# Patient Record
Sex: Male | Born: 1965
Health system: Southern US, Community
[De-identification: ages and names within clinical notes are randomized; demographics above are authoritative.]

## PROBLEM LIST (undated history)

## (undated) DIAGNOSIS — Z872 Personal history of diseases of the skin and subcutaneous tissue: Secondary | ICD-10-CM

## (undated) DIAGNOSIS — G459 Transient cerebral ischemic attack, unspecified: Secondary | ICD-10-CM

## (undated) DIAGNOSIS — K219 Gastro-esophageal reflux disease without esophagitis: Secondary | ICD-10-CM

## (undated) DIAGNOSIS — Q2112 Patent foramen ovale: Secondary | ICD-10-CM

## (undated) DIAGNOSIS — K5792 Diverticulitis of intestine, part unspecified, without perforation or abscess without bleeding: Secondary | ICD-10-CM

## (undated) DIAGNOSIS — Q211 Atrial septal defect: Secondary | ICD-10-CM

## (undated) DIAGNOSIS — R079 Chest pain, unspecified: Secondary | ICD-10-CM

## (undated) DIAGNOSIS — G4730 Sleep apnea, unspecified: Secondary | ICD-10-CM

## (undated) DIAGNOSIS — F419 Anxiety disorder, unspecified: Secondary | ICD-10-CM

## (undated) HISTORY — PX: COLON SURGERY: SHX602

## (undated) HISTORY — PX: MOUTH SURGERY: SHX715

## (undated) HISTORY — DX: Patent foramen ovale: Q21.12

## (undated) HISTORY — DX: Chest pain, unspecified: R07.9

## (undated) HISTORY — DX: Atrial septal defect: Q21.1

---

## 2005-01-13 ENCOUNTER — Emergency Department (HOSPITAL_COMMUNITY): Admission: RE | Admit: 2005-01-13 | Discharge: 2005-01-14 | Payer: Self-pay | Admitting: Family Medicine

## 2005-02-01 ENCOUNTER — Ambulatory Visit (HOSPITAL_COMMUNITY): Admission: RE | Admit: 2005-02-01 | Discharge: 2005-02-01 | Payer: Self-pay | Admitting: Gastroenterology

## 2006-11-28 ENCOUNTER — Ambulatory Visit (HOSPITAL_COMMUNITY): Admission: RE | Admit: 2006-11-28 | Discharge: 2006-11-28 | Payer: Self-pay | Admitting: Surgery

## 2006-11-28 ENCOUNTER — Emergency Department (HOSPITAL_COMMUNITY): Admission: EM | Admit: 2006-11-28 | Discharge: 2006-11-28 | Payer: Self-pay | Admitting: Emergency Medicine

## 2007-01-15 ENCOUNTER — Inpatient Hospital Stay (HOSPITAL_COMMUNITY): Admission: EM | Admit: 2007-01-15 | Discharge: 2007-01-17 | Payer: Self-pay | Admitting: Emergency Medicine

## 2007-01-15 ENCOUNTER — Encounter: Admission: RE | Admit: 2007-01-15 | Discharge: 2007-01-15 | Payer: Self-pay | Admitting: Family Medicine

## 2007-03-27 ENCOUNTER — Encounter: Admission: RE | Admit: 2007-03-27 | Discharge: 2007-03-27 | Payer: Self-pay | Admitting: Surgery

## 2007-04-26 ENCOUNTER — Encounter (INDEPENDENT_AMBULATORY_CARE_PROVIDER_SITE_OTHER): Payer: Self-pay | Admitting: Surgery

## 2007-04-26 ENCOUNTER — Inpatient Hospital Stay (HOSPITAL_COMMUNITY): Admission: RE | Admit: 2007-04-26 | Discharge: 2007-05-05 | Payer: Self-pay | Admitting: Surgery

## 2008-08-18 ENCOUNTER — Inpatient Hospital Stay (HOSPITAL_COMMUNITY): Admission: AD | Admit: 2008-08-18 | Discharge: 2008-08-20 | Payer: Self-pay | Admitting: Neurology

## 2008-08-18 ENCOUNTER — Encounter: Payer: Self-pay | Admitting: Emergency Medicine

## 2008-08-18 ENCOUNTER — Ambulatory Visit: Payer: Self-pay | Admitting: Internal Medicine

## 2008-08-19 ENCOUNTER — Encounter (INDEPENDENT_AMBULATORY_CARE_PROVIDER_SITE_OTHER): Payer: Self-pay | Admitting: Neurology

## 2009-08-10 ENCOUNTER — Emergency Department (HOSPITAL_COMMUNITY): Admission: EM | Admit: 2009-08-10 | Discharge: 2009-08-10 | Payer: Self-pay | Admitting: Emergency Medicine

## 2011-02-09 LAB — URINALYSIS, ROUTINE W REFLEX MICROSCOPIC
Bilirubin Urine: NEGATIVE
Nitrite: NEGATIVE
Urobilinogen, UA: 0.2 mg/dL (ref 0.0–1.0)
pH: 7 (ref 5.0–8.0)

## 2011-02-09 LAB — DIFFERENTIAL
Basophils Absolute: 0 10*3/uL (ref 0.0–0.1)
Basophils Relative: 1 % (ref 0–1)
Neutro Abs: 3.6 10*3/uL (ref 1.7–7.7)
Neutrophils Relative %: 62 % (ref 43–77)

## 2011-02-09 LAB — BASIC METABOLIC PANEL
Calcium: 8.9 mg/dL (ref 8.4–10.5)
GFR calc non Af Amer: >60 mL/min (ref >60)
Glucose, Bld: 93 mg/dL (ref 70–99)
Potassium: 3.8 mEq/L (ref 3.5–5.1)
Sodium: 139 mEq/L (ref 135–145)

## 2011-02-09 LAB — CBC
HCT: 43.5 % (ref 39.0–52.0)
Hemoglobin: 14.9 g/dL (ref 13.0–17.0)
MCV: 95.5 fL (ref 78.0–100.0)
Platelets: 186 10*3/uL (ref 150–400)
RBC: 4.56 MIL/uL (ref 4.22–5.81)

## 2011-03-21 NOTE — Discharge Summary (Signed)
NAMEVERNON, [name]         ACCOUNT NO.:  [id number]   MEDICAL RECORD NO.:  [id number]          PATIENT TYPE:  INP   LOCATION:  3027                         FACILITY:  [facility]   PHYSICIAN:  Pramod P. Pearlean Brownie, MD    DATE OF BIRTH:  1966/03/21   DATE OF ADMISSION:  08/18/2008  DATE OF DISCHARGE:  08/20/2008                               DISCHARGE SUMMARY   DIAGNOSES AT THE TIME OF DISCHARGE:  1. Transient ischemic attack with neurological symptoms resolved      status post full-dose intravenous tPA.  2. Diverticulitis.  3. Cigarette smoker.  4. CT of the brain on admission shows no acute abnormality.  Initial      limited magnetic resonance imaging with diffusion-weighted      (magnetic resonance) imaging only shows no acute infarct.  5. Magnetic resonance imaging of the brain shows no significant      abnormality.  Magnetic resonance angiography of the head is      negative and magnetic resonance angiography of the neck is      negative.  6. Carotid Doppler shows no internal carotid artery stenosis.  7. A 2D echocardiogram shows ejection fraction of 65% with no source      of embolus.  8. Electrocardiogram shows normal sinus rhythm.   Laboratory studies show homocystine of 12.5 and hemoglobin A1c 4.5.  Cholesterol 154, triglycerides 79, HDL of 30, and LDL 108.  Chemistry  with potassium 3.4, glucose 111, and total bilirubin 2.1.  Coagulations  on admission were normal.  CBC was normal.   HISTORY OF PRESENT ILLNESS:  [name] is a 45 year old,  right-handed Caucasian male who had sudden onset of speech difficulty  and right-handed weakness at 11:15 a.m. after coming out of the meeting  at work.  He presented to the emergency room where his speech  abnormality remained.  MRI of the brain showed no acute abnormality,  though physical exam showed worsening speech.  He had no focal weakness.  His NIH stroke scale was 2.  He was felt to be a candidate for IV tPA,  was given full-dose IV tPA and transferred to [facility] for  further stroke evaluation.   HOSPITAL COURSE:  MRI of the brain was negative for acute stroke.  It is  felt the patient likely had a TIA with resolution of symptoms.  He has  no followup therapy needs.  He was started on aspirin 81 mg for  secondary stroke prevention.  Vascular risk factors are elevated LDL at  108, for which a low-fat diet was recommended, as well as cigarette  smoking for which he was advised to quit.  There is no further inpatient  workup needed.  He has been tested for hypercoagulable state, and those  results are pending.  He will do an outpatient bubble TCD emboli  monitoring with Dr. Pearlean Brownie within the next month.   CONDITION ON DISCHARGE:  Neurologically normal.   DISCHARGE PLAN:  1. Discharge home with family.  2. Aspirin 81 mg a day for stroke prevention.  3. Outpatient bubble TCD emboli monitoring with Dr. Pearlean Brownie within the  next week.  4. Follow up with primary care physician within 1 month.  5. Stop smoking.      Annie Main, N.P.    ______________________________  Sunny Schlein. Pearlean Brownie, MD    SB/MEDQ  D:  08/20/2008  T:  08/20/2008  Job:  [id number]   cc:   Dr. Hessie Diener

## 2011-03-21 NOTE — Op Note (Signed)
[name], [name]         ACCOUNT NO.:  [id number]   MEDICAL RECORD NO.:  [id number]          PATIENT TYPE:  INP   LOCATION:  1234                         FACILITY:  [facility]   PHYSICIAN:  Wilmon Arms. Corliss Skains, M.D. DATE OF BIRTH:  01/10/1966   DATE OF PROCEDURE:  04/28/2007  DATE OF DISCHARGE:                               OPERATIVE REPORT   PREOPERATIVE DIAGNOSIS:  Postoperative bleeding after sigmoid colectomy.   POSTOPERATIVE DIAGNOSIS:  Postoperative bleeding after sigmoid  colectomy.   PROCEDURE PERFORMED:  1. Diagnostic laparoscopy converted to exploratory laparotomy.  2. Revision of colorectal anastomosis.   SURGEON:  Wilmon Arms. Corliss Skains, M.D.   ASSISTANT:  Anselm Pancoast. Zachery Dakins, M.D.   ANESTHESIA:  General endotracheal.   INDICATIONS:  The patient is a 45 year old male who is a patient of Dr.  Marca Ancona.  On 04/26/07 the patient underwent a laparoscopic assisted  sigmoid colectomy for recurrent diverticulitis.  The patient initially  had problems with low urine output and tachycardia on the first postop  night.  His hemoglobin at that time was 12.  His symptoms seemed to be  related to intravascular depletion possibly from his bowel prep.  The  patient improved with fluid boluses.  He received a large amount of  fluid overnight and his hemoglobin next morning was 10.9.  However  through the day his hemoglobin has trended down.  This morning it is  6.9.  The decision was made to proceed back to the operating room.  The  patient was stable with some intermittent fever as well as the abdominal  distension.   OPERATIVE FINDINGS:  The patient has had a large amount of blood in his  abdomen.  The bleeding source was found to be a mesenteric vessel just  posterior to his anastomosis.  This required taking down the  anastomosis, ligating the vessel and redoing anastomosis.   DESCRIPTION OF PROCEDURE:  The patient is brought to the operating room,  placed in supine  position on operating table.  After an adequate level  of general anesthesia was obtained, the patient's abdomen was prepped  with Betadine and draped in sterile fashion.  He already had a Foley  catheter.  A nasogastric tube was placed by anesthesia.  The staples  were removed.  His upper midline laparoscopic incision was opened.  The  fascial stay suture was removed.  I was able to bluntly dissect back  into the peritoneal cavity.  A stay suture of 0-0 Vicryl was placed  around the fascial opening and used to secure a Hasson cannula.  Pneumoperitoneum was obtained by insufflating CO2 maintaining maximal  pressure of 15 mmHg.  The laparoscope was inserted.  A large amount of  free blood was noted in the abdomen.  Two 5-mm ports were placed in the  right sided previous port sites.  The patient was positioned  Trendelenburg.  We irrigated and suctioned out of large amount of blood  from the pelvis.  No clear bleeding site was noted.  We turned our  attention to the upper abdomen and positioned the patient in reverse  Trendelenburg.  There was also large  amount of blood over his liver and  his spleen.  We suctioned out as much of this as possible.  We could not  clearly identify the bleeding source.  The decision was then made to  convert to an open procedure.  All the laparoscopic ports were removed.  We extended the patient's previous lower midline laparoscopic incision  up to the upper incision above the umbilicus.  The Balfour retractor was  inserted and the bladder blade was also used.  We washed out of large  amount of free blood from the abdomen.  The small bowel was packed in  the upper part of the abdomen and held with the [facility] extender.  We  could see that blood was accumulating in the pelvis near the  anastomosis.  Anastomosis was intact.  There seemed to be of a lot of  blood tracking of the left pericolic gutter but the source seemed to be  down the pelvis.  We diligently  searched for a definite bleeding site.  There are several areas which were slowly oozing, but this did not  explain the large amount of blood with found.  The decision was then  made to take down the anastomosis.  The interrupted silk sutures were  then slowly taken down individually with scissors.  Once we reached the  back wall of the anastomosis, a bleeding vessel was identified.  This  was a mesenteric vessel from the proximal descending colon.  This area  was grasped with clamp and ligated with a 2-0 silk tie.  We then  thoroughly irrigated the pelvis.  No other definite bleeding site was  noted.  We then recreated anastomosis.  We trimmed the ends of the  previous the anastomosis until we reached thin uninflamed tissue.  The  two ends reach without any tension.  2-0 interrupted silk sutures were  used to reapproximate the back wall.  We continued this around the  front.  The Gambee suture was used to imbricate the mucosa.  Once the  anastomosis was complete we inspected for good hemostasis.  The pelvis  was thoroughly irrigated.  The fascia was closed with a double-stranded  #1 PDS suture.  The subcutaneous tissues were irrigated and the skin was  closed with staples.  A dry dressing was applied.  The patient was then  extubated and brought to recovery in stable condition.  All sponge,  instrument, needle counts correct.      Wilmon Arms. Tsuei, M.D.  Electronically Signed     MKT/MEDQ  D:  04/28/2007  T:  04/29/2007  Job:  [id number]   cc:   Thomas A. Cornett, M.D.  [address] [place] [address]  [place] [address]

## 2011-03-21 NOTE — Discharge Summary (Signed)
[name], SIPPEL         ACCOUNT NO.:  [id number]   MEDICAL RECORD NO.:  [id number]          PATIENT TYPE:  INP   LOCATION:  1325                         FACILITY:  [facility]   PHYSICIAN:  Thomas A. Cornett, M.D.DATE OF BIRTH:  08/16/1966   DATE OF ADMISSION:  04/26/2007  DATE OF DISCHARGE:  05/05/2007                               DISCHARGE SUMMARY   ADMITTING DIAGNOSIS:  Sigmoid diverticulitis.   DISCHARGE DIAGNOSIS:  Sigmoid diverticulitis.   PROCEDURES PERFORMED:  1. Laparoscopic-assisted sigmoid colectomy.  2. Exploratory laparotomy due to postoperative bleeding on      postoperative day number two with revision of anastomosis and      evacuation of hemoperitoneum.   BRIEF HISTORY:  The patient is a 45 year old male with sigmoid  diverticulitis.  He presented for elective sigmoid colectomy due to  recurrent diverticulitis. on April 26, 2007.   HOSPITAL COURSE:  The patient's hospital course complicated by  postoperative bleeding.  He was returned to the operating room on  postoperative day #2 due to a significant drop in his hemoglobin.  Hemoperitoneum was encountered and this was from the posterior region of  his anastomosis from the mesentery.  The anastomosis was revised and the  bleeding was controlled.  Hemoperitoneum was evacuated.  He was then  placed in the Step Down Unit at this point in time and remained stable  for the next 48 hours and transferred to the floor.  Hemoglobin remained  around 10.  He had initially an elevated white count and this returned  to normal.  He had significant fever and this declined as time went on,  so at discharge he was afebrile.  He also had problems with severe  anxiety and shaking sweats and this was improved with Ativan.  He had a  wound that was opened on postoperative day #7, due to a small area of  fluid.  The fascia remained intact, this was managed with wet-to-dry  dressing changes b.i.d.  He remained afebrile over the  next 2 days, his  diet was advanced as his bowel function returned and he was on a soft  low residue diet at discharge.  His abdomen was soft, nontender, he was  ambulating, he was able to void without any difficulty and was  discharged home on postoperative day #9 in satisfactory condition.   DISCHARGE INSTRUCTIONS:  1. He will return this week to have his staples removed in the office.  2. Home Health will do his wound care twice a day, wet-to-dry with      normal saline.  3. He will be given a script for Vicodin ES and Ativan 0.5 mg p.o. q.6      p.r.n. anxiety.  4. He will eat a low residue diet.  5. He will shower and see me in 2 weeks.   CONDITION AT DISCHARGE:  Improved.      Thomas A. Cornett, M.D.  Electronically Signed     TAC/MEDQ  D:  05/05/2007  T:  05/05/2007  Job:  [id number]

## 2011-03-21 NOTE — Op Note (Signed)
[name], [name]         ACCOUNT NO.:  [id number]   MEDICAL RECORD NO.:  [id number]          PATIENT TYPE:  INP   LOCATION:  1525                         FACILITY:  [facility]   PHYSICIAN:  Clovis Pu. Cornett, M.D.DATE OF BIRTH:  20-Dec-1965   DATE OF PROCEDURE:  04/26/2007  DATE OF DISCHARGE:                               OPERATIVE REPORT   PREOP DIAGNOSIS:  Repeated bouts of sigmoid diverticulitis.   POSTOP DIAGNOSIS:  Repeated bouts of sigmoid diverticulitis.   PROCEDURE:  1. Laparoscopic-assisted sigmoid colectomy.  2. Takedown of splenic flexure.   SURGEON:  Maisie Fus A. Cornett, M.D.   ASSISTANTAngelia Mould. Derrell Lolling, M.D.   ANESTHESIA:  General endotracheal anesthesia with 10 mL of 0.25%  Sensorcaine local.   ESTIMATED BLOOD LOSS:  100 mL.   SPECIMEN:  Sigmoid colon to pathology.   DRAINS:  None.   INDICATIONS FOR PROCEDURE:  The patient is a 45 year old male who has  had recurrent bouts of sigmoid diverticulitis.  After discussion of the  options with him.  He wished to undergo an elective sigmoid colectomy  since he was having recurrent significant attacks.  He presents, today,  for that.  Informed consent was obtained after a long discussion in the  office.   DESCRIPTION OF PROCEDURE:  The patient was brought to the operating  suite, and placed supine.  After induction of general anesthesia, he was  placed in lithotomy; and his abdomen and perineum were prepped and  draped in a sterile fashion.  A 1-cm incision was made about 2 cm above  the umbilicus.  Dissection was carried down to the midline fascia; and  incision was made.  I pushed my finger through the peritoneal lining  into the abdominal cavity.  Pursestring suture of #0 Vicryl was placed;  and a 12-mm Hassan cannula was placed here under direct vision.   Pneumoperitoneum was created to 15 mmHg of CO2; and a laparoscope was  placed.  Laparoscopy was performed.  He had some chronic changes of the  sigmoid colon.  It was densely adherent to his left pelvic side wall in  the mid sigmoid colon.  I then placed a 5-mm ports, one in his right  lower quadrant; the second ion his left upper quadrant; and the third in  his right mid abdomen in the anterior clavicular line.   I then was able to grasp the sigmoid colon.  It was digitally adherent  to the left pelvic sidewall.  I used the harmonic skin scalpel and blunt  dissection to carefully dissect this away from what appeared to be the  iliac vessels and the ureter, which I could see quite easily with him,  since he was very thin. Once I was able to mobilize this away, I  mobilized down toward the pelvis.  He had some redundant sigmoid colon  that was tethered in his pelvis.  We carefully use harmonic scalpel and  blunt dissection to dissect this free.   We then dissected the right side of the mesentery of the distal sigmoid  colon entering  toward the rectum, identified the right ureter, and  stayed  well away from it.  I scored the peritoneum and found the  inferior mesenteric artery and the superior rectal branches going to the  rectum.  Once I mobilized the sigmoid colon from the lateral sidewall.  We then placed the patient in reverse Trendelenburg; and began to  mobilize the left colon along the left lateral attachments of the colon.  This was done under direct vision, all the way up to the splenic  flexure.  We then mobilized the splenic flexure around to the transverse  colon without difficulty to create a nice mobile colon, I thought.   Once all this was done, we were able to take the splenic flexure and  pull this down to at least the umbilicus or a little bit below that so I  felt we had very good mobilization of the splenic flexure at this point.  Once mobilization was done I inspected the gutter.  There were some  chronic inflammatory changes oozing from the mesentery, but no heavy  bleeding was noted.  I suctioned this out,  and this appeared very  hemostatic.  At this point in time, I felt that it was time to convert  to the open portion of the procedure.   The patient was then flattened out.  I measured 7 cm on his lower  abdominal wall halfway between his umbilicus and pubic symphysis.  I  then made the 7-cm incision, dissecting down to the midline fascia, and  then through the peritoneal lining into the abdominal cavity.  I used a  wound protector, and placed this in position.  I was then able to reach  in and pull out the sigmoid colon.  We had very good mobilization.  The  diseased segment down to the distal sigmoid colon we were able to pull  well into the wound protector.  We found a nice softball in the distal  sigmoid colon with no diverticula at the rectosigmoid junction that we  felt was a good distal endpoint.  The proximal end point was the distal  left colon.  The colon between had significant chronic inflammatory  changes; but the rest of the colon looked very good, I felt.  Once this  area was identified; we went ahead and I placed small bowel clamps  proximal-and-distal off this diseased segment, and then placed Kochers  on the diseased segment; and divided the bowel between those clamps and  using a scalpel blade.   Mesentery was then taken down using the harmonic scalpel which included  the inferior mesenteric vessels going toward the sigmoid colon.  This  was actually a sigmoid branch of the IMA; since this was not a cancer  operation, I did not ligate the IMA at its base though.  The LigaSure  was used for taking down the mesentery.  This preserved the main IMA  trunk going to the left colon, it looked like without compromise of  that.   Once this area was resected, it was passed off the field.  This was then  sent to pathology.  We then to both ends of the colon and this laid  together nicely with absolutely no tension whatsoever.  It was very floppy.  We then created an end-to-end  anastomosis using 3-0 silk  sutures in interrupted fashion.  Once the anastomosis was complete, it  looked tension-free and looked very nice I thought.  There was adequate  hemostasis of the mesentery.  We then irrigated off of the pelvis and  2  liters of saline.  We placed the colon back in the abdominal cavity;  and, again, this laid very nicely with a considerable amount of  floppiness and no tension that I could see.   At this point in time we suctioned out all excess irrigation.  We then  changed gloves and removed the wound protector.  We then closed the  fascia of the lower abdominal incision with a running #1 PDS.  I closed  the umbilical  port after moving all my ports with the pursestring suture of #0 Vicryl.  Staples were then used to close all skin incisions after the ports were  removed.  All final counts of sponge, needle, and instruments were found  to be correct at this portion of the case.  The patient was taken to  recovery in satisfactory condition.      Thomas A. Cornett, M.D.  Electronically Signed     TAC/MEDQ  D:  04/26/2007  T:  04/26/2007  Job:  [id number]   cc:   Miguel Aschoff, M.D.  Fax: [phone]   Griffith Citron, M.D.  Fax: [phone]

## 2011-03-21 NOTE — H&P (Signed)
NAME:  [name], [name]         ACCOUNT NO.:  [id number]   MEDICAL RECORD NO.:  [id number]          PATIENT TYPE:  EMS   LOCATION:  ED                           FACILITY:  [facility]   PHYSICIAN:  Pramod P. Pearlean Brownie, MD    DATE OF BIRTH:  01-13-1966   DATE OF ADMISSION:  08/18/2008  DATE OF DISCHARGE:                              HISTORY & PHYSICAL   REFERRING PHYSICIAN:  Tinnie Gens P. Caporossi, MD.   REASON FOR REFERRAL:  __________ stroke.   HISTORY OF PRESENT ILLNESS:  [name] is a 45 year old Caucasian male  who was brought to [facility] Emergency Room by [facility] when he  developed sudden onset of speech difficulties, right-sided numbness at  about 11 a.m. today.  The patient is unable to provide a history, which  was obtained from his friends who were present with him at work.  Apparently the patient was fine in the morning, attended a meeting, then  he states he was not feeling well, in fact, calling his sister over the  phone but had trouble speaking and word-finding difficulties.  He also  noticed some right-sided numbness in his hands and possibly some  weakness.  After coming to the emergency room these symptoms improved.  He was able to speak quite well and had practically no deficits except  some word-finding difficulties in the ER.  Initial CT scan of the head  was unremarkable.  Dr. Weldon Inches spoke to me and I advised to get an MRI  scan and when I arrived, the patient was in the MRI scanner.  The  patient requested that his MRI be interrupted because he wanted to pass  urine.  I saw the patient.  After he came out he was clearly having word-  finding speech difficulties and hence the MRI was stopped and the  patient was brought back to the emergency room and examined and he  clearly had word-finding difficulties and was struggling to speak,  though he could understand well.  He also had trouble with naming and  repetition.  There was no peripheral vision loss, facial  weakness, focal  extremity __________.  Patient's limited amount of  __________ been  obtained included __________ imaging and FLAIR images which did not show  a definite acute stroke.  T2 weighted imaging __________ vessels seemed  to be appropriate except the right vertebral artery flow was of  diminished caliber, likely from __________ .  The patient has apparently  no past medical history except smoking and diverticulitis.   HOME MEDICATIONS:  Multivitamins.   SOCIAL HISTORY:  The patient is separated and going through a divorce.  He lives in [place].  He works in Multimedia programmer.  He smokes 1 pack  per day.  Does not do drugs.   MEDICATION ALLERGIES:  None listed.   PAST SURGICAL HISTORY:  None listed.   REVIEW OF SYSTEMS:  Negative for recent fever, cough, chills, __________  illness.  The patient does have significant anxiety and distress as per  discussion with his girlfriend.   PHYSICAL EXAM:  Reveals a young healthy Caucasian male who is at present  not  in distress, is afebrile.  Pulse 87 __________  regular, respiratory 16 __________ , blood pressure  142/94, sats 100% on room air.  Head is non traumatic.  NECK:  Supple.  No bruit.  ENT EXAM:  Unremarkable.  CARDIAC EXAM:  No murmur or gallop.  LUNGS:  Clear to auscultation.  ABDOMEN:  Soft, nontender.  NEUROLOGIC EXAM:  He is awake, alert.  He appears anxious.  He has  significant expressive aphasia, word-finding difficulties, __________ he  has difficulty with naming, repetition.  He is able to write only parts  of sentences and not complete sentences.  He __________ bilaterally.  Visual fields __________ to confrontational testing.  Face is symmetric.  Tongue is midline.  Motor system exam reveals __________ symmetric  strength, tone, reflexes.  Sensation is intact.  Gait was not tested.   __________ CAT scan unremarkable.  Limited MRI is only available.  __________  and coronal images did not show any acute  infarct.  T2  weighted images revealed patent flow in both middle cerebral arteries  and carotids with diminished right vertebral artery flow.   IMPRESSION:  A 45 year old gentleman with fluctuating symptoms of speech  and language difficulties with some questionable right-sided numbness,  likely a left middle cerebral artery transient ischemic attack versus  small stroke.   PLANS:  Since the patient's symptoms have recurred and he does have  significant dysarthria/expressive aphasia, I think he meets the criteria  for IV thrombolysis with tPA.  Will give a full dose, 0.9 mg per kilo,  and follow standard tPA protocol.  The patient will be transferred to  [facility] to the intensive care unit for post tPA care.  Will  complete stroke workup by completing the MRI scan of the brain later,  along with MRA and a 2D echocardiogram, fasting lipid profile,  hemoglobin A1c, homocysteine.  Given his young age, will need to check  __________ echocardiogram, cardiac source of embolism, telemetry  monitoring, as well as labs for hypercoagulability.  I had a long  discussion with the patient's friend, as well as girlfriend, who were  present here, and will speak to his wife when she arrives later.  I have  discussed the risk of intracerebral hemorrhage, with tPA being  __________ with the patient as well as his friend and they understand  and are in agreement.           ______________________________  Sunny Schlein. Pearlean Brownie, MD     PPS/MEDQ  D:  08/18/2008  T:  08/18/2008  Job:  [id number]

## 2011-03-24 NOTE — H&P (Signed)
[name], [name]         ACCOUNT NO.:  [id number]   MEDICAL RECORD NO.:  [id number]          PATIENT TYPE:  INP   LOCATION:  1317                         FACILITY:  [facility]   PHYSICIAN:  Andres Shad. Rudean Curt, MD     DATE OF BIRTH:  12-30-1965   DATE OF ADMISSION:  01/15/2007  DATE OF DISCHARGE:                              HISTORY & PHYSICAL   CHIEF COMPLAINT:  Diverticulitis.   HISTORY OF PRESENT ILLNESS:  [name] is an otherwise healthy 45-year-  old with a history of diverticulitis.  He had his first flare of this  approximately two years ago and was treated without patient antibiotics  with a good response.  He was otherwise well until yesterday when he  developed lower abdominal pain associated with chills and a mild  temperature elevation over the course of the day.  Today he was seen at  an [facility] where he was found to have a temperature of 99.9  and a very tender abdomen on exam.  His white blood cell count was found  to be elevated at 13.6 and CT scan of the abdomen revealed sigmoid  diverticulitis with a micro perforation.   PAST MEDICAL HISTORY:  1. Diverticulitis.  2. Cold urticaria.   MEDICATIONS:  Metamucil.   ALLERGIES:  GASTROVIEW and SULFONAMIDES.   FAMILY HISTORY:  The patient is adopted.   SOCIAL HISTORY:  Social drinking.  No tobacco or drugs.  The patient is  married and has two children.  He has had a recent bout of the flu.   PHYSICAL EXAMINATION:  VITAL SIGNS:  Temperature 99.6, heart rate 92,  blood pressure 138/90, respiratory rate 18, oxygen saturation 100% on  room air.  GENERAL APPEARANCE:  The patient was alert, pleasant and in no apparent  distress.  HEENT:  Mucous membranes were moist.  Oropharynx was slightly  erythematous.  NECK:  No lymphadenopathy.  No thyromegaly.  CHEST:  Clear to auscultation bilaterally.  CARDIOVASCULAR:  Regular.  Normal S1 and S2.  No murmurs, rubs or  gallops.  ABDOMEN:  Normal bowel  sounds.  Soft. Considerable tenderness in the  lower abdomen, especially in the left lower quadrant.  EXTREMITIES:  Well perfused.  No edema.   LABORATORY DATA:  White blood cell count 14.1, hematocrit 44.1,  hemoglobin 15.5, platelets 211,000, neutrophils 77%, lymphocytes 12%,  monocytes 1.4%.  Electrolytes showed sodium 138, potassium 3.5, chloride  102, CO2 29, BUN 8, creatinine 1.1, glucose 92.  Urinalysis was  unremarkable.  CT scan of the abdomen and pelvis revealed a normal  appendix, inflammatory changes involving the sigmoid colon, consistent  with sigmoid diverticulitis.  A tiny micro perforation characterized by  a small extraluminal gas bubble was noted.   ASSESSMENT/PLAN:  1. Acute diverticulitis.  Because of the patient's constitutional      symptoms, leukocytosis and evidence of micro perforation and      transluminal inflammation, I will admit the patient to the hospital      for intravenous antibiotics.  He will be started on ciprofloxacin      400 mg IV q.12h. and metronidazole 500  mg IV q.8h.  I will treat      him with intravenous fluids and keep him on a clear liquid diet.      Tomorrow, if his white blood cell count has come down and his      abdominal exam is more benign, his diet can be advanced.  He will      receive morphine as needed for pain and Zofran as needed for      nausea.  2. Inguinal hernia.  The patient is scheduled to have inguinal hernia      repair in April by Dr. Luisa Hart.  Dr. Luisa Hart was called when the      patient arrived in the emergency department.  I will not consult on      him during this admission unless the need arises.  However, I have      instructed the patient that he should follow up with Dr. Luisa Hart as      an outpatient to see if his surgical plans need to be modified.      Andres Shad. Rudean Curt, MD  Electronically Signed     PML/MEDQ  D:  01/15/2007  T:  01/17/2007  Job:  [id number]   cc:   Miguel Aschoff, M.D.  Fax: [phone]    Clovis Pu. Cornett, M.D.  [address]  [place] [address]

## 2011-08-07 LAB — CBC
HCT: 44.6
HCT: 45.9
MCV: 94.6
Platelets: 180
Platelets: 177
RBC: 4.71
RDW: 12.4
RDW: 12.3
WBC: 9.6

## 2011-08-07 LAB — COMPREHENSIVE METABOLIC PANEL
ALT: 21
AST: 24
Alkaline Phosphatase: 52
Alkaline Phosphatase: 45
CO2: 25
Chloride: 109
GFR calc Af Amer: >60
GFR calc Af Amer: >60
Glucose, Bld: 70
Sodium: 141
Total Bilirubin: 2.3 — ABNORMAL HIGH
Total Bilirubin: 2.1 — ABNORMAL HIGH
Total Protein: 6.3

## 2011-08-07 LAB — PROTEIN C, TOTAL: Protein C, Total: 81 % (ref 70–140)

## 2011-08-07 LAB — BETA-2-GLYCOPROTEIN I ABS, IGG/M/A
Beta-2 Glyco I IgG: <4 U/mL (ref <20)
Beta-2-Glycoprotein I IgA: <4 U/mL (ref <10)
Beta-2-Glycoprotein I IgM: <4 U/mL (ref <10)

## 2011-08-07 LAB — LUPUS ANTICOAGULANT PANEL
Lupus Anticoagulant: NOT DETECTED
dRVVT Incubated 1:1 Mix: 40.7 (ref 36.1–47.0)

## 2011-08-07 LAB — DIFFERENTIAL
Eosinophils Relative: 3
Lymphs Abs: 2
Monocytes Absolute: 0.5
Neutro Abs: 4.7
Neutrophils Relative %: 63

## 2011-08-07 LAB — PROTIME-INR: Prothrombin Time: 14.1

## 2011-08-07 LAB — ANTITHROMBIN III: AntiThromb III Func: 105 (ref 76–126)

## 2011-08-07 LAB — PROTEIN S, TOTAL: Protein S Ag, Total: 81 % (ref 70–140)

## 2011-08-07 LAB — LIPID PANEL
Cholesterol: 154
LDL Cholesterol: 108 — ABNORMAL HIGH
Total CHOL/HDL Ratio: 5.1

## 2011-08-07 LAB — SAMPLE TO BLOOD BANK

## 2011-08-07 LAB — CARDIOLIPIN ANTIBODIES, IGG, IGM, IGA: Anticardiolipin IgA: <7 — ABNORMAL LOW (ref <13)

## 2011-08-07 LAB — HOMOCYSTEINE: Homocysteine: 12.5

## 2011-08-07 LAB — APTT
aPTT: 26
aPTT: 26

## 2011-08-07 LAB — CK TOTAL AND CKMB (NOT AT ARMC)
Relative Index: 1.4
Total CK: 173

## 2011-08-23 LAB — COMPREHENSIVE METABOLIC PANEL
ALT: 21
ALT: 24
AST: 20
AST: 33
Albumin: 2.3 — ABNORMAL LOW
Albumin: 2.6 — ABNORMAL LOW
Albumin: 2.9 — ABNORMAL LOW
Albumin: 3.8
Alkaline Phosphatase: 35 — ABNORMAL LOW
Alkaline Phosphatase: 40
Alkaline Phosphatase: 43
BUN: 13
BUN: 13
BUN: 6
BUN: 8
BUN: 9
CO2: 24
CO2: 25
CO2: 28
CO2: 28
CO2: 31
Calcium: 7.3 — ABNORMAL LOW
Calcium: 7.6 — ABNORMAL LOW
Calcium: 8.1 — ABNORMAL LOW
Chloride: 106
Chloride: 107
Chloride: 108
Creatinine, Ser: 1.03
Creatinine, Ser: 1.06
Creatinine, Ser: 1.11
Creatinine, Ser: 1.49
Creatinine, Ser: 1.71 — ABNORMAL HIGH
GFR calc Af Amer: >60
GFR calc Af Amer: >60
GFR calc non Af Amer: 45 — ABNORMAL LOW
GFR calc non Af Amer: 52 — ABNORMAL LOW
GFR calc non Af Amer: >60
GFR calc non Af Amer: >60
GFR calc non Af Amer: >60
Glucose, Bld: 116 — ABNORMAL HIGH
Glucose, Bld: 158 — ABNORMAL HIGH
Glucose, Bld: 179 — ABNORMAL HIGH
Glucose, Bld: 89
Potassium: 3.2 — ABNORMAL LOW
Potassium: 3.5
Potassium: 4.8
Sodium: 139
Sodium: 140
Total Bilirubin: 1.9 — ABNORMAL HIGH
Total Bilirubin: 2 — ABNORMAL HIGH
Total Bilirubin: 2.6 — ABNORMAL HIGH
Total Bilirubin: 2.8 — ABNORMAL HIGH
Total Bilirubin: 2.9 — ABNORMAL HIGH
Total Bilirubin: 2.9 — ABNORMAL HIGH
Total Protein: 4.5 — ABNORMAL LOW
Total Protein: 6.6

## 2011-08-23 LAB — TYPE AND SCREEN: Antibody Screen: NEGATIVE

## 2011-08-23 LAB — DIFFERENTIAL
Basophils Absolute: 0
Basophils Absolute: 0
Basophils Absolute: 0
Basophils Absolute: 0
Basophils Absolute: 0.1
Basophils Absolute: 0.1
Basophils Relative: 0
Basophils Relative: 0
Basophils Relative: 0
Basophils Relative: 1
Eosinophils Absolute: 0.2
Eosinophils Absolute: 0.2
Eosinophils Relative: 0
Eosinophils Relative: 4
Lymphocytes Relative: 11 — ABNORMAL LOW
Lymphocytes Relative: 16
Lymphocytes Relative: 2 — ABNORMAL LOW
Lymphocytes Relative: 28
Lymphocytes Relative: 5 — ABNORMAL LOW
Lymphs Abs: 0.7
Lymphs Abs: 0.7
Lymphs Abs: 1.6
Monocytes Absolute: 0.5
Monocytes Absolute: 1.1 — ABNORMAL HIGH
Monocytes Relative: 10
Monocytes Relative: 10
Monocytes Relative: 10
Monocytes Relative: 6
Neutro Abs: 11.4 — ABNORMAL HIGH
Neutro Abs: 18.3 — ABNORMAL HIGH
Neutro Abs: 3.3
Neutro Abs: 5.7
Neutro Abs: 6.4
Neutrophils Relative %: 59
Neutrophils Relative %: 71
Neutrophils Relative %: 77
Neutrophils Relative %: 78 — ABNORMAL HIGH
Neutrophils Relative %: 80 — ABNORMAL HIGH
Neutrophils Relative %: 92 — ABNORMAL HIGH
WBC Morphology: INCREASED

## 2011-08-23 LAB — BASIC METABOLIC PANEL
BUN: 7
CO2: 29
Calcium: 8.1 — ABNORMAL LOW
Calcium: 8.3 — ABNORMAL LOW
Creatinine, Ser: 0.87
GFR calc Af Amer: >60
GFR calc Af Amer: >60
GFR calc non Af Amer: >60
Glucose, Bld: 105 — ABNORMAL HIGH
Potassium: 3.2 — ABNORMAL LOW
Sodium: 137

## 2011-08-23 LAB — CBC
HCT: 19.3 — ABNORMAL LOW
HCT: 26.7 — ABNORMAL LOW
HCT: 28.1 — ABNORMAL LOW
HCT: 29.4 — ABNORMAL LOW
HCT: 29.5 — ABNORMAL LOW
HCT: 29.9 — ABNORMAL LOW
HCT: 31 — ABNORMAL LOW
HCT: 42.8
Hemoglobin: 10.1 — ABNORMAL LOW
Hemoglobin: 10.2 — ABNORMAL LOW
Hemoglobin: 10.3 — ABNORMAL LOW
Hemoglobin: 6.7 — CL
Hemoglobin: 9.3 — ABNORMAL LOW
MCHC: 34.8
MCHC: 34.9
MCHC: 35
MCHC: 35.4
MCHC: 35.9
MCV: 90.1
MCV: 90.1
MCV: 90.4
MCV: 90.6
MCV: 91.2
MCV: 91.7
MCV: 91.7
MCV: 91.9
MCV: 91.9
Platelets: 121 — ABNORMAL LOW
Platelets: 129 — ABNORMAL LOW
Platelets: 129 — ABNORMAL LOW
Platelets: 198
Platelets: 252
Platelets: 296
RBC: 2.1 — ABNORMAL LOW
RBC: 2.91 — ABNORMAL LOW
RBC: 3.11 — ABNORMAL LOW
RBC: 3.21 — ABNORMAL LOW
RBC: 3.24 — ABNORMAL LOW
RBC: 3.24 — ABNORMAL LOW
RBC: 3.27 — ABNORMAL LOW
RBC: 3.28 — ABNORMAL LOW
RBC: 3.78 — ABNORMAL LOW
RDW: 12.8
RDW: 12.9
RDW: 13.2
RDW: 13.2
RDW: 13.3
WBC: 20.5 — ABNORMAL HIGH
WBC: 34.6 — ABNORMAL HIGH
WBC: 6.2
WBC: 7.7
WBC: 8
WBC: 8

## 2011-08-23 LAB — POCT I-STAT 4, (NA,K, GLUC, HGB,HCT)
Glucose, Bld: 96
HCT: 15 — ABNORMAL LOW
Hemoglobin: 5.1 — CL
Hemoglobin: 9.5 — ABNORMAL LOW
Potassium: 3.6
Sodium: 129 — ABNORMAL LOW
Sodium: 137

## 2011-08-23 LAB — PROTIME-INR: Prothrombin Time: 14.9

## 2011-08-23 LAB — PREPARE RBC (CROSSMATCH)

## 2011-08-23 LAB — ABO/RH: ABO/RH(D): A POS

## 2011-08-23 LAB — HEMOGLOBIN AND HEMATOCRIT, BLOOD: HCT: 36.8 — ABNORMAL LOW

## 2012-07-19 ENCOUNTER — Emergency Department (HOSPITAL_COMMUNITY)
Admission: EM | Admit: 2012-07-19 | Discharge: 2012-07-20 | Disposition: A | Discharge status: Home / Self Care | Payer: BC Managed Care – PPO | Attending: Emergency Medicine | Admitting: Emergency Medicine

## 2012-07-19 ENCOUNTER — Encounter (HOSPITAL_COMMUNITY): Payer: Self-pay | Admitting: Emergency Medicine

## 2012-07-19 ENCOUNTER — Emergency Department (HOSPITAL_COMMUNITY): Payer: BC Managed Care – PPO

## 2012-07-19 DIAGNOSIS — R509 Fever, unspecified: Secondary | ICD-10-CM | POA: Insufficient documentation

## 2012-07-19 DIAGNOSIS — R748 Abnormal levels of other serum enzymes: Secondary | ICD-10-CM | POA: Insufficient documentation

## 2012-07-19 DIAGNOSIS — Z8673 Personal history of transient ischemic attack (TIA), and cerebral infarction without residual deficits: Secondary | ICD-10-CM | POA: Insufficient documentation

## 2012-07-19 DIAGNOSIS — R109 Unspecified abdominal pain: Secondary | ICD-10-CM | POA: Insufficient documentation

## 2012-07-19 DIAGNOSIS — Z87891 Personal history of nicotine dependence: Secondary | ICD-10-CM | POA: Insufficient documentation

## 2012-07-19 HISTORY — DX: Transient cerebral ischemic attack, unspecified: G45.9

## 2012-07-19 HISTORY — DX: Diverticulitis of intestine, part unspecified, without perforation or abscess without bleeding: K57.92

## 2012-07-19 LAB — CBC
HCT: 42.8 % (ref 39.0–52.0)
Hemoglobin: 15.5 g/dL (ref 13.0–17.0)
MCH: 32.2 pg (ref 26.0–34.0)
MCHC: 36.2 g/dL — ABNORMAL HIGH (ref 30.0–36.0)
MCV: 89 fL (ref 78.0–100.0)
RBC: 4.81 MIL/uL (ref 4.22–5.81)

## 2012-07-19 LAB — URINALYSIS, ROUTINE W REFLEX MICROSCOPIC
Hgb urine dipstick: NEGATIVE
Nitrite: NEGATIVE
Protein, ur: NEGATIVE mg/dL
Specific Gravity, Urine: 1.021 (ref 1.005–1.030)
Urobilinogen, UA: 0.2 mg/dL (ref 0.0–1.0)

## 2012-07-19 LAB — URINE MICROSCOPIC-ADD ON

## 2012-07-19 LAB — COMPREHENSIVE METABOLIC PANEL
ALT: 30 U/L (ref 0–53)
BUN: 11 mg/dL (ref 6–23)
CO2: 24 mEq/L (ref 19–32)
Calcium: 8.7 mg/dL (ref 8.4–10.5)
Creatinine, Ser: 1.03 mg/dL (ref 0.50–1.35)
GFR calc Af Amer: >90 mL/min (ref >90)
GFR calc non Af Amer: 85 mL/min — ABNORMAL LOW (ref >90)
Glucose, Bld: 99 mg/dL (ref 70–99)
Total Protein: 6.8 g/dL (ref 6.0–8.3)

## 2012-07-19 LAB — LIPASE, BLOOD: Lipase: 230 U/L — ABNORMAL HIGH (ref 11–59)

## 2012-07-19 MED ORDER — SODIUM CHLORIDE 0.9 % IV BOLUS (SEPSIS)
1000.0000 mL | Freq: Once | INTRAVENOUS | Status: AC
  Administered 2012-07-19: 1000 mL via INTRAVENOUS

## 2012-07-19 MED ORDER — ACETAMINOPHEN 325 MG PO TABS
650.0000 mg | ORAL_TABLET | Freq: Once | ORAL | Status: AC
  Administered 2012-07-19: 650 mg via ORAL
  Filled 2012-07-19: qty 2

## 2012-07-19 NOTE — ED Notes (Signed)
RN will obtain labs with the start of IV. °

## 2012-07-19 NOTE — ED Notes (Signed)
Pt c/o pain to mid abd radiating around to flanks, intermittent. Chills, denies n/v/d. Febrile in triage.

## 2012-07-20 MED ORDER — ONDANSETRON HCL 4 MG PO TABS: 4.0000 mg | ORAL_TABLET | Freq: Four times a day (QID) | ORAL | Status: DC

## 2012-07-20 MED ORDER — ONDANSETRON HCL 4 MG PO TABS: 4.0000 mg | ORAL_TABLET | Freq: Four times a day (QID) | ORAL | Status: AC

## 2012-07-20 NOTE — ED Provider Notes (Signed)
History     CSN: [id number]  Arrival date & time 07/19/12  2022   First MD Initiated Contact with Patient 07/19/12 2248      Chief Complaint  Patient presents with  . Abdominal Pain    (Consider location/radiation/quality/duration/timing/severity/associated sxs/prior treatment) HPI [name] presents to emergency room complaining of abdominal pain. Patient reports onset of pain this morning. Pain starts in mid abdomen and radiates around his flanks. Pain is mild, and dull. Patient has had fever and chills. No nausea no vomiting. No sick contacts. No travel no unusual foods. Patient has remote history of diverticulitis status post colectomy. No prior history of pancreatitis, gallbladder disease.  Past Medical History  Diagnosis Date  . Diverticulitis   . TIA (transient ischemic attack)   . VSD (ventricular septal defect)     Past Surgical History  Procedure Date  . Colon surgery     No family history on file.  History  Substance Use Topics  . Smoking status: Former Games developer  . Smokeless tobacco: Not on file  . Alcohol Use: Yes     1-2 drinks daily      Review of Systems  All other systems reviewed and are negative.    Allergies  Contrast media; Other; and Sulfa drugs cross reactors  Home Medications   Current Outpatient Rx  Name Route Sig Dispense Refill  . ACETAMINOPHEN 500 MG PO TABS Oral Take 1,000 mg by mouth every 6 (six) hours as needed.    . ASPIRIN EC 81 MG PO TBEC Oral Take 81 mg by mouth daily.    . B COMPLEX PO TABS Oral Take 1 tablet by mouth daily.    Marland Kitchen VITAMIN C 500 MG PO TABS Oral Take 500 mg by mouth daily.      BP 127/83  Pulse 101  Temp 101.6 F (38.7 C) (Oral)  Resp 18  SpO2 100%  Physical Exam  Nursing note and vitals reviewed. Constitutional: He is oriented to person, place, and time. He appears well-developed and well-nourished. No distress.  HENT:  Head: Normocephalic and atraumatic.  Nose: Nose normal.  Mouth/Throat:  Oropharynx is clear and moist.  Eyes: Conjunctivae normal and EOM are normal. Pupils are equal, round, and reactive to light.  Neck: Normal range of motion. Neck supple. No JVD present. No tracheal deviation present. No thyromegaly present.  Cardiovascular: Normal rate, regular rhythm, normal heart sounds and intact distal pulses.  Exam reveals no gallop and no friction rub.   No murmur heard. Pulmonary/Chest: Effort normal and breath sounds normal. No stridor. No respiratory distress. He has no wheezes. He has no rales. He exhibits no tenderness.  Abdominal: Soft. Bowel sounds are normal. He exhibits no distension and no mass. There is tenderness (Very mild diffuse tenderness, no Murphy's sign no epigastric tenderness). There is no rebound and no guarding.  Musculoskeletal: Normal range of motion. He exhibits no edema and no tenderness.  Lymphadenopathy:    He has no cervical adenopathy.  Neurological: He is alert and oriented to person, place, and time.  Skin: Skin is dry. No rash noted. He is not diaphoretic. No erythema. No pallor.  Psychiatric: He has a normal mood and affect. His behavior is normal. Judgment and thought content normal.    ED Course  Procedures (including critical care time)  Labs Reviewed  CBC - Abnormal; Notable for the following:    MCHC 36.2 (*)     All other components within normal limits  COMPREHENSIVE METABOLIC  PANEL - Abnormal; Notable for the following:    Total Bilirubin 1.9 (*)     GFR calc non Af Amer 85 (*)     All other components within normal limits  LIPASE, BLOOD - Abnormal; Notable for the following:    Lipase 230 (*)     All other components within normal limits  URINALYSIS, ROUTINE W REFLEX MICROSCOPIC - Abnormal; Notable for the following:    Leukocytes, UA SMALL (*)     All other components within normal limits  URINE MICROSCOPIC-ADD ON   Ct Abdomen Pelvis Wo Contrast  07/20/2012  *RADIOLOGY REPORT*  Clinical Data: [age] year old Holland with  abdominal pelvic pain and fever.  CT ABDOMEN AND PELVIS WITHOUT CONTRAST  Technique:  Multidetector CT imaging of the abdomen and pelvis was performed following the standard protocol without intravenous contrast.  Comparison: 01/15/2007 CT  Findings: The lung bases are clear.  The liver, spleen, adrenal glands, gallbladder, pancreas and kidneys are unremarkable except for a right renal cyst.  Please note that parenchymal abnormalities may be missed as intravenous contrast was not administered. No free fluid, enlarged lymph nodes, biliary dilation or abdominal aortic aneurysm identified. The bowel and bladder are unremarkable except for a few scattered colonic diverticula. The appendix is normal. Small bilateral inguinal hernias containing fat are again noted.  No acute or suspicious bony abnormalities are identified.  IMPRESSION: No evidence of acute abnormality.   Original Report Authenticated By: Rosendo Gros, M.D.      1. Fever   2. Abdominal pain   3. Elevated lipase       MDM   45 year old Holland with one day of diffuse mild abdominal pain with fever and chills. Patient with elevated lipase, but no signs of acute pancreatitis on physical exam. No signs of gallstones on CT scan, and patient overall looks well. Discussed with patient close followup with primary care doctor and return for worsening symptoms       Olivia Mackie, MD 07/20/12 [phone]

## 2014-02-12 ENCOUNTER — Emergency Department (HOSPITAL_COMMUNITY): Payer: BC Managed Care – PPO

## 2014-02-12 DIAGNOSIS — R209 Unspecified disturbances of skin sensation: Secondary | ICD-10-CM | POA: Insufficient documentation

## 2014-02-12 DIAGNOSIS — Z87891 Personal history of nicotine dependence: Secondary | ICD-10-CM | POA: Insufficient documentation

## 2014-02-12 DIAGNOSIS — R079 Chest pain, unspecified: Secondary | ICD-10-CM | POA: Insufficient documentation

## 2014-02-12 LAB — CBC
HCT: 42.1 % (ref 39.0–52.0)
HEMOGLOBIN: 14.9 g/dL (ref 13.0–17.0)
MCH: 32 pg (ref 26.0–34.0)
MCHC: 35.4 g/dL (ref 30.0–36.0)
MCV: 90.3 fL (ref 78.0–100.0)
Platelets: 177 10*3/uL (ref 150–400)
RBC: 4.66 MIL/uL (ref 4.22–5.81)
RDW: 12.6 % (ref 11.5–15.5)
WBC: 6.2 10*3/uL (ref 4.0–10.5)

## 2014-02-12 LAB — I-STAT TROPONIN, ED: Troponin i, poc: 0 ng/mL (ref 0.00–0.08)

## 2014-02-12 LAB — BASIC METABOLIC PANEL
BUN: 9 mg/dL (ref 6–23)
CALCIUM: 9.3 mg/dL (ref 8.4–10.5)
CO2: 23 meq/L (ref 19–32)
Chloride: 102 mEq/L (ref 96–112)
Creatinine, Ser: 1.01 mg/dL (ref 0.50–1.35)
GFR calc Af Amer: >90 mL/min (ref >90)
GFR calc non Af Amer: 87 mL/min — ABNORMAL LOW (ref >90)
GLUCOSE: 112 mg/dL — AB (ref 70–99)
POTASSIUM: 3.8 meq/L (ref 3.7–5.3)
SODIUM: 141 meq/L (ref 137–147)

## 2014-02-12 NOTE — ED Notes (Signed)
Pt in c/o chest "tingling" since this am, denies pain, states he did have an episode this morning of pain between his shoulder blades but that resolved after around 30 min, symptoms have been intermittent but continue to return, denies other symptoms with this, denies pain at this time, no distress noted

## 2014-02-13 ENCOUNTER — Encounter (HOSPITAL_COMMUNITY): Payer: Self-pay | Admitting: Emergency Medicine

## 2014-02-13 ENCOUNTER — Emergency Department (HOSPITAL_COMMUNITY)
Admission: EM | Admit: 2014-02-13 | Discharge: 2014-02-13 | Disposition: A | Discharge status: Home / Self Care | Payer: BC Managed Care – PPO | Attending: Emergency Medicine | Admitting: Emergency Medicine

## 2014-02-13 ENCOUNTER — Emergency Department (HOSPITAL_COMMUNITY)
Admission: EM | Admit: 2014-02-13 | Discharge: 2014-02-13 | Discharge status: Against Medical Advice | Payer: BC Managed Care – PPO | Attending: Emergency Medicine | Admitting: Emergency Medicine

## 2014-02-13 DIAGNOSIS — Z79899 Other long term (current) drug therapy: Secondary | ICD-10-CM | POA: Insufficient documentation

## 2014-02-13 DIAGNOSIS — Z8719 Personal history of other diseases of the digestive system: Secondary | ICD-10-CM | POA: Insufficient documentation

## 2014-02-13 DIAGNOSIS — R079 Chest pain, unspecified: Secondary | ICD-10-CM

## 2014-02-13 DIAGNOSIS — Z8679 Personal history of other diseases of the circulatory system: Secondary | ICD-10-CM | POA: Insufficient documentation

## 2014-02-13 DIAGNOSIS — Z7982 Long term (current) use of aspirin: Secondary | ICD-10-CM | POA: Insufficient documentation

## 2014-02-13 DIAGNOSIS — R072 Precordial pain: Secondary | ICD-10-CM | POA: Insufficient documentation

## 2014-02-13 DIAGNOSIS — Z87891 Personal history of nicotine dependence: Secondary | ICD-10-CM | POA: Insufficient documentation

## 2014-02-13 DIAGNOSIS — Z8673 Personal history of transient ischemic attack (TIA), and cerebral infarction without residual deficits: Secondary | ICD-10-CM | POA: Insufficient documentation

## 2014-02-13 LAB — CBC
HEMATOCRIT: 44.4 % (ref 39.0–52.0)
Hemoglobin: 16 g/dL (ref 13.0–17.0)
MCH: 32.5 pg (ref 26.0–34.0)
MCHC: 36 g/dL (ref 30.0–36.0)
MCV: 90.2 fL (ref 78.0–100.0)
Platelets: 175 10*3/uL (ref 150–400)
RBC: 4.92 MIL/uL (ref 4.22–5.81)
RDW: 12.4 % (ref 11.5–15.5)
WBC: 6.3 10*3/uL (ref 4.0–10.5)

## 2014-02-13 LAB — BASIC METABOLIC PANEL
BUN: 9 mg/dL (ref 6–23)
CO2: 22 mEq/L (ref 19–32)
CREATININE: 0.91 mg/dL (ref 0.50–1.35)
Calcium: 9.1 mg/dL (ref 8.4–10.5)
Chloride: 102 mEq/L (ref 96–112)
Glucose, Bld: 86 mg/dL (ref 70–99)
POTASSIUM: 3.9 meq/L (ref 3.7–5.3)
Sodium: 139 mEq/L (ref 137–147)

## 2014-02-13 LAB — I-STAT TROPONIN, ED: Troponin i, poc: 0 ng/mL (ref 0.00–0.08)

## 2014-02-13 NOTE — ED Notes (Signed)
Pt reporting cp yesterday between his shoulder blades; today intermittent CP central. Denies SOB. Pt is a x 4. Skin warm and dry. Reports he was here last night for same but left.

## 2014-02-13 NOTE — ED Provider Notes (Signed)
CSN: [id number]     Arrival date & time 02/13/14  1212 History   First MD Initiated Contact with Patient 02/13/14 1335     Chief Complaint  Patient presents with  . Chest Pain     (Consider location/radiation/quality/duration/timing/severity/associated sxs/prior Treatment) HPI Comments: Patient is a 48 year old male with no prior cardiac history with the exception of septal defect causing a TIA in the past. He presents today with complaints of intermittent tightness in his chest that has been occurring for the past 2 days. He presented here initially last night, however left without being seen due to prolonged waiting time. He denies any shortness of breath, nausea, diaphoresis, or radiation to the arm or jaw. He denies any exertional symptoms. He states he had a similar episode 2 years ago while on a business trip in [place]. He states he had a stress test performed at that time which was negative.  Patient is a 47 y.o. male presenting with chest pain. The history is provided by the patient.  Chest Pain Pain location:  Substernal area Pain quality: tightness   Pain radiates to:  Does not radiate Pain radiates to the back: no   Pain severity:  Mild Onset quality:  Gradual Duration:  2 days Timing:  Intermittent Progression:  Unchanged Chronicity:  Recurrent Relieved by:  Nothing Worsened by:  Nothing tried Ineffective treatments:  None tried   Past Medical History  Diagnosis Date  . Diverticulitis   . TIA (transient ischemic attack)   . VSD (ventricular septal defect)    Past Surgical History  Procedure Laterality Date  . Colon surgery     No family history on file. History  Substance Use Topics  . Smoking status: Former Research scientist (life sciences)  . Smokeless tobacco: Not on file  . Alcohol Use: Yes     Comment: 1-2 drinks daily    Review of Systems  Cardiovascular: Positive for chest pain.  All other systems reviewed and are negative.     Allergies  Contrast media; Other;  and Sulfa drugs cross reactors  Home Medications   Current Outpatient Rx  Name  Route  Sig  Dispense  Refill  . acetaminophen (TYLENOL) 500 MG tablet   Oral   Take 1,000 mg by mouth every 6 (six) hours as needed for mild pain.          Marland Kitchen aspirin EC 81 MG tablet   Oral   Take 81 mg by mouth daily.         Marland Kitchen b complex vitamins tablet   Oral   Take 1 tablet by mouth daily.         Marland Kitchen FIBER PO   Oral   Take 1 capsule by mouth daily.         Marland Kitchen omega-3 acid ethyl esters (LOVAZA) 1 G capsule   Oral   Take 1 g by mouth daily.         . vitamin C (ASCORBIC ACID) 500 MG tablet   Oral   Take 500 mg by mouth daily.          BP 126/80  Pulse 60  Temp(Src) 98.8 F (37.1 C) (Oral)  Resp 16  Ht 6' (1.829 m)  Wt 195 lb (88.451 kg)  BMI 26.44 kg/m2  SpO2 99% Physical Exam  Nursing note and vitals reviewed. Constitutional: He is oriented to person, place, and time. He appears well-developed and well-nourished. No distress.  HENT:  Head: Normocephalic and atraumatic.  Mouth/Throat: Oropharynx is  clear and moist.  Neck: Normal range of motion. Neck supple.  Cardiovascular: Normal rate, regular rhythm and normal heart sounds.   No murmur heard. Pulmonary/Chest: Effort normal and breath sounds normal. No respiratory distress. He has no wheezes.  Abdominal: Soft. Bowel sounds are normal. He exhibits no distension. There is no tenderness.  Musculoskeletal: Normal range of motion. He exhibits no edema.  Neurological: He is alert and oriented to person, place, and time.  Skin: Skin is warm and dry. He is not diaphoretic.    ED Course  Procedures (including critical care time) Labs Review Labs Reviewed  CBC  BASIC METABOLIC PANEL  I-STAT [place], ED   Imaging Review Dg Chest 2 View  02/12/2014   CLINICAL DATA:  Chest pain  EXAM: CHEST  2 VIEW  COMPARISON:  11/28/2006  FINDINGS: The heart size and mediastinal contours are within normal limits. Both lungs are clear.  The visualized skeletal structures are unremarkable.  IMPRESSION: No active cardiopulmonary disease.   Electronically Signed   By: Inez Catalina M.D.   On: 02/12/2014 20:54     EKG Interpretation   Date/Time:  Friday February 13 2014 12:22:01 EDT Ventricular Rate:  64 PR Interval:  154 QRS Duration: 84 QT Interval:  386 QTC Calculation: 398 R Axis:   61 Text Interpretation:  Normal sinus rhythm Normal ECG Confirmed by DELOS   MD, Jackye Dever ([id number]) on 02/13/2014 1:54:43 PM      MDM   Final diagnoses:  None    Patient is a 49 year old male who presents with complaints of chest discomfort. These symptoms have been occurring intermittently for the past 2 days. His troponin last night and again today was 0. His EKG shows a sinus rhythm with no acute changes. He had a stress test 2 years ago which was unremarkable. His symptoms are atypical for cardiac pain and I doubt a cardiac etiology. He appears very stable and I believe appropriate for discharge. He will be given the contact information for cardiology at his request which he can call to schedule a follow up appointment to discuss whether or not further testing is indicated. He understands to return if his symptoms substantially worsen or change.    Veryl Speak, MD 02/13/14 1356

## 2014-02-13 NOTE — Discharge Instructions (Signed)
Call [facility] to arrange a followup appointment. The contact information has been provided on this discharge summary.  Return to the emergency department if you develop worsening symptoms, difficulty breathing, or other new or bothersome symptoms.   Chest Pain (Nonspecific) It is often hard to give a specific diagnosis for the cause of chest pain. There is always a chance that your pain could be related to something serious, such as a heart attack or a blood clot in the lungs. You need to follow up with your caregiver for further evaluation. CAUSES   Heartburn.  Pneumonia or bronchitis.  Anxiety or stress.  Inflammation around your heart (pericarditis) or lung (pleuritis or pleurisy).  A blood clot in the lung.  A collapsed lung (pneumothorax). It can develop suddenly on its own (spontaneous pneumothorax) or from injury (trauma) to the chest.  Shingles infection (herpes zoster virus). The chest wall is composed of bones, muscles, and cartilage. Any of these can be the source of the pain.  The bones can be bruised by injury.  The muscles or cartilage can be strained by coughing or overwork.  The cartilage can be affected by inflammation and become sore (costochondritis). DIAGNOSIS  Lab tests or other studies, such as X-rays, electrocardiography, stress testing, or cardiac imaging, may be needed to find the cause of your pain.  TREATMENT   Treatment depends on what may be causing your chest pain. Treatment may include:  Acid blockers for heartburn.  Anti-inflammatory medicine.  Pain medicine for inflammatory conditions.  Antibiotics if an infection is present.  You may be advised to change lifestyle habits. This includes stopping smoking and avoiding alcohol, caffeine, and chocolate.  You may be advised to keep your head raised (elevated) when sleeping. This reduces the chance of acid going backward from your stomach into your esophagus.  Most of the time,  nonspecific chest pain will improve within 2 to 3 days with rest and mild pain medicine. HOME CARE INSTRUCTIONS   If antibiotics were prescribed, take your antibiotics as directed. Finish them even if you start to feel better.  For the next few days, avoid physical activities that bring on chest pain. Continue physical activities as directed.  Do not smoke.  Avoid drinking alcohol.  Only take over-the-counter or prescription medicine for pain, discomfort, or fever as directed by your caregiver.  Follow your caregiver's suggestions for further testing if your chest pain does not go away.  Keep any follow-up appointments you made. If you do not go to an appointment, you could develop lasting (chronic) problems with pain. If there is any problem keeping an appointment, you must call to reschedule. SEEK MEDICAL CARE IF:   You think you are having problems from the medicine you are taking. Read your medicine instructions carefully.  Your chest pain does not go away, even after treatment.  You develop a rash with blisters on your chest. SEEK IMMEDIATE MEDICAL CARE IF:   You have increased chest pain or pain that spreads to your arm, neck, jaw, back, or abdomen.  You develop shortness of breath, an increasing cough, or you are coughing up blood.  You have severe back or abdominal pain, feel nauseous, or vomit.  You develop severe weakness, fainting, or chills.  You have a fever. THIS IS AN EMERGENCY. Do not wait to see if the pain will go away. Get medical help at once. Call your local emergency services (911 in [place] Do not drive yourself to the hospital. MAKE SURE YOU:  Understand these instructions.  Will watch your condition.  Will get help right away if you are not doing well or get worse. Document Released: 08/02/2005 Document Revised: 01/15/2012 Document Reviewed: 05/28/2008 [facility] Patient Information 2014 [place].

## 2014-02-13 NOTE — ED Notes (Signed)
Chest xray discontinued due to patient having chest xray last night

## 2014-03-10 ENCOUNTER — Ambulatory Visit (INDEPENDENT_AMBULATORY_CARE_PROVIDER_SITE_OTHER): Payer: BC Managed Care – PPO | Admitting: Cardiology

## 2014-03-10 ENCOUNTER — Encounter: Payer: Self-pay | Admitting: Cardiology

## 2014-03-10 VITALS — BP 120/90 | HR 70 | Ht 72.0 in | Wt 197.0 lb

## 2014-03-10 DIAGNOSIS — Q211 Atrial septal defect: Secondary | ICD-10-CM

## 2014-03-10 DIAGNOSIS — Z8673 Personal history of transient ischemic attack (TIA), and cerebral infarction without residual deficits: Secondary | ICD-10-CM

## 2014-03-10 DIAGNOSIS — R079 Chest pain, unspecified: Secondary | ICD-10-CM

## 2014-03-10 DIAGNOSIS — Q2111 Secundum atrial septal defect: Secondary | ICD-10-CM

## 2014-03-10 DIAGNOSIS — Q2112 Patent foramen ovale: Secondary | ICD-10-CM

## 2014-03-10 NOTE — Patient Instructions (Signed)
Your physician has requested that you have an exercise tolerance test. For further information please visit [email or web address]. Please also follow instruction sheet, as given.    Exercise Stress Electrocardiogram An exercise stress electrocardiogram is a test to check how blood flows to your heart. It is done to find areas of poor blood flow. You will need to walk on a treadmill for this test. The electrocardiogram will record your heartbeat when you are at rest and when you are exercising. BEFORE THE PROCEDURE  Do not have drinks with caffeine or foods with caffeine for 24 hours before the test, or as told by your doctor.  Follow your doctor's instructions about eating and drinking before the test.  Ask your doctor what medicines you should or should not take before the test. Take your medicines with water unless told by your doctor not to.  If you use an inhaler, bring it with you to the test.  Bring a snack to eat after the test.  Do not  smoke for 4 hours before the test.  Wear comfortable shoes and clothing. PROCEDURE  You will have patches put on your chest. Small areas of your chest may need to be shaved. Wires will be connected to the patches.  Your heart rate will be watched while you are resting and while you are exercising.  You will walk on the treadmill. The treadmill will slowly get faster to raise your heart rate.  The test will take about 1 2 hours. AFTER THE PROCEDURE  Your heart rate and blood pressure will be watched after the test.  You may return to your normal diet, activities, and medicines or as told by your doctor. Document Released: 04/10/2008 Document Revised: 08/13/2013 Document Reviewed: 06/30/2013 [facility] Patient Information 2014 [place].

## 2014-03-10 NOTE — Progress Notes (Signed)
[facility]. [address], [address] [place], [place]  [address] Phone: [phone] Fax:  [phone]  Date:  03/10/2014   ID:  [name], DOB [birth year], MRN [id number]  PCP:   Melinda Crutch, MD   History of Present Illness: [name] is a 48 y.o. male here for evaluation of chest pain. More of a sensation, uneasy. Day before flew back from [place] woke up in [place], worried. Anxious.  In the past, he has had a TIA secondary to atrial septal defect or ventricular septal defect according to history. On 02/13/14 he presented to the emergency department with intermittent chest tightness over a 2 day duration. No associated nausea, diaphoresis, radiation of symptoms. Does not appear to be exertional. Approximately 2 years ago while on a business trip to [place] he had a similar episode where a stress test was performed at that time it was negative/low risk.   2009 had bubble study, transcranial Doppler with Dr. Leonie Man, abnormal. TIA. ASA daily recommended.   In college fluttering sensations. Wore monitor normal.   No further discomfort   Wt Readings from Last 3 Encounters:  03/10/14 197 lb (89.359 kg)  02/13/14 195 lb (88.451 kg)  02/12/14 195 lb (88.451 kg)     Past Medical History  Diagnosis Date  . Diverticulitis   . TIA (transient ischemic attack)   . PFO (patent foramen ovale)   . Chest pain     Past Surgical History  Procedure Laterality Date  . Colon surgery      Current Outpatient Prescriptions  Medication Sig Dispense Refill  . acetaminophen (TYLENOL) 500 MG tablet Take 1,000 mg by mouth every 6 (six) hours as needed for mild pain.       Marland Kitchen aspirin EC 81 MG tablet Take 81 mg by mouth daily.      Marland Kitchen b complex vitamins tablet Take 1 tablet by mouth daily.      Marland Kitchen FIBER PO Take 1 capsule by mouth daily.      Marland Kitchen omega-3 acid ethyl esters (LOVAZA) 1 G capsule Take 1 g by mouth daily.      . vitamin C (ASCORBIC ACID) 500 MG tablet Take 500 mg by mouth daily.        No current facility-administered medications for this visit.    Allergies:    Allergies  Allergen Reactions  . Contrast Media [Iodinated Diagnostic Agents] Anaphylaxis  . Other Anaphylaxis    gastrovue  . Sulfa Drugs Cross Reactors Other (See Comments)    Childhood allergy, unsure    Social History:  The patient  reports that he has quit smoking. He does not have any smokeless tobacco history on file. He reports that he drinks alcohol. He reports that he does not use illicit drugs.   No family history on file.Adopted.   ROS:  Please see the history of present illness.   Denies any fevers, chills, orthopnea, PND, syncope, stroke like symptoms   All other systems reviewed and negative.   PHYSICAL EXAM: VS:  BP 120/90  Pulse 70  Ht 6' (1.829 m)  Wt 197 lb (89.359 kg)  BMI 26.71 kg/m2 Well nourished, well developed, in no acute distress HEENT: normal, [place]/AT, EOMI Neck: no JVD, normal carotid upstroke, no bruit Cardiac:  normal S1, S2; RRR; no murmur Lungs:  clear to auscultation bilaterally, no wheezing, rhonchi or rales Abd: soft, nontender, no hepatomegaly, no bruits Ext: no edema, 2+ distal pulses Skin: warm and dry GU: deferred  Neuro: no focal abnormalities noted, AAO x 3  EKG:  02/13/14-sinus rhythm, 64, normal, no ST segment changes Echocardiogram 08/19/08-normal ejection fraction, no mention of VSD or ASD.   Labs: 02/13/14-troponin normal, potassium 3.9, creatinine 0.9, hemoglobin 16  ASSESSMENT AND PLAN:  1. Atypical chest discomfort-we will go ahead and pursue exercise treadmill test to exclude any obvious evidence of ischemia. Lab work and EKG were reassuring. In the past, had a TIA and saw Dr. Leonie Man. A transcranial Doppler was performed which was abnormal. Has been taking aspirin ever since. Likely small PFO. Possibilities for chest pain including musculoskeletal, GERD. Perhaps daily aspirin causing some irritation. 2. History of TIA-no further symptoms. Daily  aspirin. No bleeding side effects. There is no need for surveillance echocardiogram. 3. We will follow up with ETT results.  Signed, Candee Furbish, MD [facility]  03/10/2014 12:27 PM

## 2014-04-03 ENCOUNTER — Encounter (HOSPITAL_COMMUNITY): Payer: BC Managed Care – PPO

## 2014-04-06 ENCOUNTER — Emergency Department (HOSPITAL_COMMUNITY): Payer: BC Managed Care – PPO

## 2014-04-06 ENCOUNTER — Telehealth (HOSPITAL_COMMUNITY): Payer: Self-pay | Admitting: *Deleted

## 2014-04-06 ENCOUNTER — Encounter (HOSPITAL_COMMUNITY): Payer: Self-pay | Admitting: Emergency Medicine

## 2014-04-06 ENCOUNTER — Emergency Department (HOSPITAL_COMMUNITY)
Admission: EM | Admit: 2014-04-06 | Discharge: 2014-04-06 | Disposition: A | Discharge status: Home / Self Care | Payer: BC Managed Care – PPO | Attending: Emergency Medicine | Admitting: Emergency Medicine

## 2014-04-06 DIAGNOSIS — Z79899 Other long term (current) drug therapy: Secondary | ICD-10-CM | POA: Insufficient documentation

## 2014-04-06 DIAGNOSIS — R0789 Other chest pain: Secondary | ICD-10-CM | POA: Insufficient documentation

## 2014-04-06 DIAGNOSIS — Z7982 Long term (current) use of aspirin: Secondary | ICD-10-CM | POA: Insufficient documentation

## 2014-04-06 DIAGNOSIS — Z8774 Personal history of (corrected) congenital malformations of heart and circulatory system: Secondary | ICD-10-CM | POA: Insufficient documentation

## 2014-04-06 DIAGNOSIS — Z8719 Personal history of other diseases of the digestive system: Secondary | ICD-10-CM | POA: Insufficient documentation

## 2014-04-06 DIAGNOSIS — Z8673 Personal history of transient ischemic attack (TIA), and cerebral infarction without residual deficits: Secondary | ICD-10-CM | POA: Insufficient documentation

## 2014-04-06 DIAGNOSIS — R079 Chest pain, unspecified: Secondary | ICD-10-CM

## 2014-04-06 DIAGNOSIS — Z87891 Personal history of nicotine dependence: Secondary | ICD-10-CM | POA: Insufficient documentation

## 2014-04-06 LAB — BASIC METABOLIC PANEL
BUN: 13 mg/dL (ref 6–23)
CALCIUM: 9.4 mg/dL (ref 8.4–10.5)
CO2: 23 mEq/L (ref 19–32)
CREATININE: 0.97 mg/dL (ref 0.50–1.35)
Chloride: 102 mEq/L (ref 96–112)
GFR calc non Af Amer: >90 mL/min (ref >90)
Glucose, Bld: 128 mg/dL — ABNORMAL HIGH (ref 70–99)
Potassium: 4.4 mEq/L (ref 3.7–5.3)
Sodium: 137 mEq/L (ref 137–147)

## 2014-04-06 LAB — I-STAT TROPONIN, ED
TROPONIN I, POC: 0 ng/mL (ref 0.00–0.08)
Troponin i, poc: 0 ng/mL (ref 0.00–0.08)

## 2014-04-06 LAB — CBC
HCT: 44.8 % (ref 39.0–52.0)
Hemoglobin: 15.8 g/dL (ref 13.0–17.0)
MCH: 31.9 pg (ref 26.0–34.0)
MCHC: 35.3 g/dL (ref 30.0–36.0)
MCV: 90.5 fL (ref 78.0–100.0)
PLATELETS: 201 10*3/uL (ref 150–400)
RBC: 4.95 MIL/uL (ref 4.22–5.81)
RDW: 12.8 % (ref 11.5–15.5)
WBC: 6.3 10*3/uL (ref 4.0–10.5)

## 2014-04-06 NOTE — ED Notes (Signed)
Pt states that he has been having intermittent central chest tightness that has been going on for about month or so. Pt was seen at [facility] in April and saw a cardiologist and is scheduled to have stress test nest week.  Pt states that pain started again this weekend and he is concerned but nothing makes it better/worse. Pt denies radiation, n/v, dizzy or lightheadedness.

## 2014-04-06 NOTE — Discharge Instructions (Signed)
Your chest pain does not appear to be cardiac, however, keep your appointment with Dr Marlou Porch office tomorrow.

## 2014-04-06 NOTE — ED Provider Notes (Signed)
CSN: [id number]     Arrival date & time 04/06/14  1205 History   First MD Initiated Contact with Patient 04/06/14 1459     Chief Complaint  Patient presents with  . Chest Pain     (Consider location/radiation/quality/duration/timing/severity/associated sxs/prior Treatment) HPI  Traveling in [place] and was seen in emergency department and admitted and had a exercise stress test done that was normal. He reports he was seen at [facility] emergency department last month for chest pressure. He has been seen by Dr. Marlou Porch, cardiology in the office. He is scheduled to have a exercise stress test done in 11 days. He reports however over the weekend he was having pain off and on. He reports this morning he had pain and it started about 9:00 this morning and lasted until about a half hour before I went into his room. He reports he was having pain when his EKG was done today. He describes the pain as pressure. He states it can wake him up at night and  it can also happen during the day. He states nothing he does makes it hurt more, he states nothing he does makes it feel better. He denies being under stress, he states he's happy at work at his home life. However he states when he gets the pain he gets panicky. He denies diaphoresis, shortness of breath, nausea, vomiting, or radiation. He states the pain can last minutes or a couple of hours.  Patient states he called his cardiologist's office this morning he has an appointment to see the PA tomorrow.  Family history patient is adopted  PCP Dr Harrington Challenger Cardiologist Dr Marlou Porch  Past Medical History  Diagnosis Date  . Diverticulitis   . TIA (transient ischemic attack)   . PFO (patent foramen ovale)   . Chest pain    Past Surgical History  Procedure Laterality Date  . Colon surgery     No family history on file. History  Substance Use Topics  . Smoking status: Former Research scientist (life sciences)  . Smokeless tobacco: Not on file  . 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     Allergies  Contrast media; Other; and Sulfa drugs cross reactors  Home Medications   Prior to Admission medications   Medication Sig Start Date End Date Taking? Authorizing Provider  aspirin EC 81 MG tablet Take 81 mg by mouth every evening.    Yes Historical Provider, MD  b complex vitamins tablet Take 1 tablet by mouth every evening.    Yes Historical Provider, MD  omega-3 acid ethyl esters (LOVAZA) 1 G capsule Take 1 g by mouth every evening.    Yes Historical Provider, MD  polycarbophil (FIBERCON) 625 MG tablet Take 1,875 mg by mouth every evening.   Yes Historical Provider, MD  vitamin C (ASCORBIC ACID) 500 MG tablet Take 500 mg by mouth every evening.    Yes Historical Provider, MD   BP 133/79  Pulse 62  Temp(Src) 98.2 F (36.8 C) (Oral)  Resp 16  SpO2 95%  Vital signs normal   Physical Exam  Nursing note and vitals reviewed. Constitutional: He is oriented to person, place, and time. He appears well-developed and well-nourished.  Non-toxic appearance. He does not appear ill. No distress.  HENT:  Head: Normocephalic and atraumatic.  Right Ear: External ear normal.  Left Ear: External ear normal.  Nose:  Nose normal. No mucosal edema or rhinorrhea.  Mouth/Throat: Oropharynx is clear and moist and mucous membranes are normal. No dental abscesses or uvula swelling.  Eyes: Conjunctivae and EOM are normal. Pupils are equal, round, and reactive to light.  Neck: Normal range of motion and full passive range of motion without pain. Neck supple.  Cardiovascular: Normal rate, regular rhythm and normal heart sounds.  Exam reveals no gallop and no friction rub.   No murmur heard. Pulmonary/Chest: Effort normal and breath sounds normal. No respiratory distress. He has no wheezes. He has no rhonchi. He has no rales. He exhibits no tenderness and no crepitus.     Area of chest pain noted  Abdominal: Soft. Normal appearance and bowel sounds are normal. He exhibits no distension. There is no tenderness. There is no rebound and no guarding.  Musculoskeletal: Normal range of motion. He exhibits no edema and no tenderness.  Moves all extremities well.   Neurological: He is alert and oriented to person, place, and time. He has normal strength. No cranial nerve deficit.  Skin: Skin is warm, dry and intact. No rash noted. No erythema. No pallor.  Psychiatric: He has a normal mood and affect. His speech is normal and behavior is normal. His mood appears not anxious.    ED Course  Procedures (including critical care time)  16:06 D/W Dr Burt Knack, if his second troponin in negative, can keep his appointment tomorrow.     Labs Review Results for orders placed during the hospital encounter of 04/06/14  CBC      Result Value Ref Range   WBC 6.3  4.0 - 10.5 K/uL   RBC 4.95  4.22 - 5.81 MIL/uL   Hemoglobin 15.8  13.0 - 17.0 g/dL   HCT 44.8  39.0 - 52.0 %   MCV 90.5  78.0 - 100.0 fL   MCH 31.9  26.0 - 34.0 pg   MCHC 35.3  30.0 - 36.0 g/dL   RDW 12.8  11.5 - 15.5 %   Platelets 201  150 - 400 K/uL  BASIC METABOLIC PANEL      Result Value Ref Range   Sodium 137  137 - 147 mEq/L   Potassium 4.4  3.7 - 5.3 mEq/L   Chloride 102  96 - 112 mEq/L   CO2 23  19 - 32 mEq/L   Glucose, Bld 128 (*) 70 - 99 mg/dL   BUN 13  6 - 23 mg/dL   Creatinine, Ser 0.97  0.50 - 1.35 mg/dL   Calcium 9.4  8.4 - 10.5 mg/dL   GFR calc non Af Amer >90  >90 mL/min   GFR calc Af Amer >90  >90 mL/min  I-STAT TROPOININ, ED      Result Value Ref Range   Troponin i, poc 0.00  0.00 - 0.08 ng/mL   Comment 3           I-STAT TROPOININ, ED      Result Value Ref Range   Troponin i, poc 0.00  0.00 - 0.08 ng/mL   Comment 3              Imaging Review Dg Chest Port 1 View  04/06/2014   CLINICAL DATA:  Chest pain  EXAM: PORTABLE CHEST - 1 VIEW  COMPARISON:  February 12, 2014  FINDINGS:  There is no edema or consolidation. The heart size and pulmonary vascularity are normal. No adenopathy. No bone lesions. No pneumothorax.  IMPRESSION: No edema or consolidation.  Electronically Signed   By: Lowella Grip M.D.   On: 04/06/2014 15:21     EKG Interpretation   Date/Time:  Monday April 06 2014 12:12:59 EDT Ventricular Rate:  63 PR Interval:  161 QRS Duration: 89 QT Interval:  401 QTC Calculation: 410 R Axis:   51 Text Interpretation:  Sinus rhythm RSR' in V1 or V2, right VCD or RVH  Otherwise no significant change Since last tracing 13 Feb 2014 Confirmed  by [facility]  MD-I, Kailen Name ([id number]) on 04/06/2014 3:02:31 PM      MDM   patient presents with atypical chest pain that he has had for years. He is art he had a normal stress test a couple years ago. He is scheduled to have another stress test in about 11 days. He art he has cardiology followup. He has no known risk factors for heart disease however he is adopted. Patient does admit he gets anxious and panicky when he has the pain.    Final diagnoses:  Chest pain    Plan discharge  Rolland Porter, MD, Alanson Aly, MD 04/06/14 [id number]

## 2014-04-07 ENCOUNTER — Ambulatory Visit (INDEPENDENT_AMBULATORY_CARE_PROVIDER_SITE_OTHER): Payer: BC Managed Care – PPO | Admitting: Physician Assistant

## 2014-04-07 ENCOUNTER — Encounter (INDEPENDENT_AMBULATORY_CARE_PROVIDER_SITE_OTHER): Payer: Self-pay

## 2014-04-07 ENCOUNTER — Encounter: Payer: Self-pay | Admitting: Physician Assistant

## 2014-04-07 VITALS — BP 130/96 | HR 62 | Ht 72.0 in | Wt 197.0 lb

## 2014-04-07 DIAGNOSIS — R079 Chest pain, unspecified: Secondary | ICD-10-CM

## 2014-04-07 MED ORDER — OMEPRAZOLE 20 MG PO CPDR: 20.0000 mg | DELAYED_RELEASE_CAPSULE | Freq: Every day | ORAL | Status: DC

## 2014-04-07 NOTE — Patient Instructions (Signed)
START PRILOSEC 20 MG DAILY; MAKE SURE TO TAKE 30 MINUTES BEFORE YOUR BREAKFAST; TAKE THIS FOR 4 WEEKS THEN AS NEEDED   FOLLOW UP WITH DR. Marlou Porch AS PLANNED AFTER YOUR STRESS TEST

## 2014-04-07 NOTE — Progress Notes (Signed)
Cardiology Office Note   Date:  04/07/2014   ID:  [name], DOB 06-09-1966, MRN [id number]  PCP:   Melinda Crutch, MD  Cardiologist:  Dr. Candee Furbish      History of Present Illness: [name] is a 48 y.o. male with a hx of TIA in 08/2008 with complete resolution of symptoms after IV tPA.  MRI was neg at that time.  Echo demonstrated normal LVF and no source of embolus.  Recently seen by Dr. Candee Furbish for evaluation of chest pain.  He had been in the ED in 02/2014.  Symptoms were atypical and he was set up for ETT which is later this month.  He was seen in the ED yesterday with recurrent chest pain.  This was constant for 2 days.  His CEs remained negative.  He was asked to follow up here today.  He denies exertional symptoms.  Denies any assoc dyspnea, nausea or diaphoresis.  He denies DOE, orthopnea, PND, edema.  Denies syncope.  Symptoms do not seem to be related to meals.  Denies pleuritic chest pain.  Denies chest pain with lying supine.  Symptoms cause significant anxiety and worry for him.     Studies:  - Echo (08/2008):  EF 65 %, no left ventricular regional wall motion abnormalities.  - Carotid US (08/2008): Bilateral no significant plaque visualized. 40-60% ICA stenosis by velocities, plaque morphology not consistent with stenosis.Vertebral artery flow antegrade bilaterally.  - MRA of Neck (08/2008):  No significant carotid or vertebral artery stenosis.    Recent Labs: 04/06/2014: Creatinine 0.97; Hemoglobin 15.8; Potassium 4.4   Wt Readings from Last 3 Encounters:  04/07/14 197 lb (89.359 kg)  03/10/14 197 lb (89.359 kg)  02/13/14 195 lb (88.451 kg)     Past Medical History  Diagnosis Date  . Diverticulitis   . TIA (transient ischemic attack)   . PFO (patent foramen ovale)   . Chest pain     Current Outpatient Prescriptions  Medication Sig Dispense Refill  . aspirin EC 81 MG tablet Take 81 mg by mouth every evening.       Marland Kitchen b complex vitamins tablet  Take 1 tablet by mouth every evening.       . Calcium Citrate (CITRACAL PO) Take by mouth.      . Fish Oil OIL by Does not apply route.      . vitamin C (ASCORBIC ACID) 500 MG tablet Take 500 mg by mouth every evening.        No current facility-administered medications for this visit.    Allergies:   Contrast media; Other; and Sulfa drugs cross reactors   Social History:  The patient  reports that he has quit smoking. He does not have any smokeless tobacco history on file. He reports that he drinks alcohol. He reports that he does not use illicit drugs.   Family History:  The patient's family history is not on file.   ROS:  Please see the history of present illness.      All other systems reviewed and negative.   PHYSICAL EXAM: VS:  BP 130/96  Pulse 62  Ht 6' (1.829 m)  Wt 197 lb (89.359 kg)  BMI 26.71 kg/m2 Well nourished, well developed, in no acute distress HEENT: normal Neck: no JVD Vascular:  No carotid bruits Cardiac:  normal S1, S2; RRR; no murmur Lungs:  clear to auscultation bilaterally, no wheezing, rhonchi or rales Abd: soft, nontender, no hepatomegaly Ext: no edema Skin: warm  and dry Neuro:  CNs 2-12 intact, no focal abnormalities noted  EKG:  NSR, HR 62, normal axis, no ST changes     ASSESSMENT AND PLAN:  1. Chest pain:  Symptoms are quite atypical for ischemia.  We discussed the different causes of chest pain.  He had neg CEs after 2 days of chest pain.  He may have felt some tickle in his throat with symptoms previously.  I have recommended a trial of PPI to cover for a GI etiology.  Start Prilosec 20 mg QD.  Proceed with ETT as planned. 2. Elevated BP:  BP has been ok in the past.  He will keep an eye on this.  No need to start Rx at this time.  3. Disposition:  F/u with Dr. Candee Furbish as planned.    Signed, Versie Starks, MHS 04/07/2014 11:36 AM    [facility] [address], [place], [place]  [address] Phone: [phone];  Fax: [phone]

## 2014-04-15 ENCOUNTER — Telehealth (HOSPITAL_COMMUNITY): Payer: Self-pay

## 2014-04-17 ENCOUNTER — Ambulatory Visit (HOSPITAL_COMMUNITY)
Admission: RE | Admit: 2014-04-17 | Discharge: 2014-04-17 | Disposition: A | Discharge status: Home / Self Care | Payer: BC Managed Care – PPO | Source: Ambulatory Visit | Attending: Cardiology | Admitting: Cardiology

## 2014-04-17 DIAGNOSIS — R079 Chest pain, unspecified: Secondary | ICD-10-CM | POA: Insufficient documentation

## 2014-04-27 ENCOUNTER — Telehealth: Payer: Self-pay | Admitting: Cardiology

## 2014-04-27 NOTE — Telephone Encounter (Signed)
Contacted about GXT results and told him that per the test report, pt exercised according to the CVN brunce.  Per the test results it was a normal gxt with the pt demonstrating excellent exercise capacity and achieving a 15.1 met workload and 98%. Treadmill score was a 8.  Informed pt that per his last OV with Richardson Dopp PA, pt is to f/u with Dr Marlou Porch. Pt currently does not have a f/u appt scheduled with Dr Marlou Porch.  Informed pt that I will send our schedulers a message to contact pt and have this arranged.  Pt verbalized understanding and agrees with this plan.

## 2014-04-27 NOTE — Telephone Encounter (Signed)
New Message  Pt called for stress test results//SR

## 2014-05-14 NOTE — Telephone Encounter (Signed)
Encounter complete. 

## 2014-06-05 ENCOUNTER — Other Ambulatory Visit: Payer: Self-pay | Admitting: Nurse Practitioner

## 2014-06-05 ENCOUNTER — Other Ambulatory Visit: Payer: Self-pay | Admitting: Gastroenterology

## 2014-06-05 DIAGNOSIS — R0789 Other chest pain: Secondary | ICD-10-CM

## 2014-06-08 ENCOUNTER — Ambulatory Visit
Admission: RE | Admit: 2014-06-08 | Discharge: 2014-06-08 | Disposition: A | Discharge status: Home / Self Care | Payer: BC Managed Care – PPO | Source: Ambulatory Visit | Attending: Gastroenterology | Admitting: Gastroenterology

## 2014-06-08 DIAGNOSIS — R0789 Other chest pain: Secondary | ICD-10-CM

## 2014-06-17 ENCOUNTER — Ambulatory Visit: Payer: BC Managed Care – PPO | Admitting: Cardiology

## 2014-06-19 ENCOUNTER — Ambulatory Visit (INDEPENDENT_AMBULATORY_CARE_PROVIDER_SITE_OTHER): Payer: BC Managed Care – PPO | Admitting: Surgery

## 2014-06-30 ENCOUNTER — Other Ambulatory Visit: Payer: Self-pay | Admitting: Family Medicine

## 2014-06-30 DIAGNOSIS — R079 Chest pain, unspecified: Secondary | ICD-10-CM

## 2014-06-30 DIAGNOSIS — R0600 Dyspnea, unspecified: Secondary | ICD-10-CM

## 2014-07-01 ENCOUNTER — Inpatient Hospital Stay: Admission: RE | Admit: 2014-07-01 | Payer: BC Managed Care – PPO | Source: Ambulatory Visit

## 2014-07-01 ENCOUNTER — Other Ambulatory Visit: Payer: Self-pay | Admitting: Family Medicine

## 2014-07-01 ENCOUNTER — Ambulatory Visit
Admission: RE | Admit: 2014-07-01 | Discharge: 2014-07-01 | Disposition: A | Discharge status: Home / Self Care | Payer: BC Managed Care – PPO | Source: Ambulatory Visit | Attending: Family Medicine | Admitting: Family Medicine

## 2014-07-01 DIAGNOSIS — R0600 Dyspnea, unspecified: Secondary | ICD-10-CM

## 2014-07-01 DIAGNOSIS — R079 Chest pain, unspecified: Secondary | ICD-10-CM

## 2014-07-02 ENCOUNTER — Other Ambulatory Visit: Payer: BC Managed Care – PPO

## 2014-07-15 ENCOUNTER — Ambulatory Visit (INDEPENDENT_AMBULATORY_CARE_PROVIDER_SITE_OTHER): Payer: BC Managed Care – PPO | Admitting: Surgery

## 2014-07-22 ENCOUNTER — Ambulatory Visit (INDEPENDENT_AMBULATORY_CARE_PROVIDER_SITE_OTHER): Payer: BC Managed Care – PPO | Admitting: Surgery

## 2014-08-14 ENCOUNTER — Ambulatory Visit (INDEPENDENT_AMBULATORY_CARE_PROVIDER_SITE_OTHER): Payer: BC Managed Care – PPO | Admitting: Surgery

## 2014-08-21 ENCOUNTER — Other Ambulatory Visit: Payer: Self-pay

## 2015-02-22 NOTE — Progress Notes (Signed)
Please put orders in Epic surgery 02-26-15 pre op 02-25-15 Thanks

## 2015-02-24 NOTE — Patient Instructions (Signed)
Torben Soloway  02/24/2015   Your procedure is scheduled on:    02/26/2015    Report to [facility] Main  Entrance and follow signs to               [place] at      0530AM.  Call this number if you have problems the morning of surgery [phone]   Remember:  Do not eat food or drink liquids :After Midnight.     Take these medicines the morning of surgery with A SIP OF WATER: none                                You may not have any metal on your body including hair pins and              piercings  Do not wear jewelry,  lotions, powders or perfumes., deodorant.                        Men may shave face and neck.   Do not bring valuables to the hospital. [place].  Contacts, dentures or bridgework may not be worn into surgery.      Patients discharged the day of surgery will not be allowed to drive home.  Name and phone number of your driver:  Special Instructions: coughing and deep breathing exercises, leg exercises               Please read over the following fact sheets you were given: _____________________________________________________________________             [facility] - Preparing for Surgery Before surgery, you can play an important role.  Because skin is not sterile, your skin needs to be as free of germs as possible.  You can reduce the number of germs on your skin by washing with CHG (chlorahexidine gluconate) soap before surgery.  CHG is an antiseptic cleaner which kills germs and bonds with the skin to continue killing germs even after washing. Please DO NOT use if you have an allergy to CHG or antibacterial soaps.  If your skin becomes reddened/irritated stop using the CHG and inform your nurse when you arrive at Short Stay. Do not shave (including legs and underarms) for at least 48 hours prior to the first CHG shower.  You may shave your face/neck. Please follow these  instructions carefully:  1.  Shower with CHG Soap the night before surgery and the  morning of Surgery.  2.  If you choose to wash your hair, wash your hair first as usual with your  normal  shampoo.  3.  After you shampoo, rinse your hair and body thoroughly to remove the  shampoo.                           4.  Use CHG as you would any other liquid soap.  You can apply chg directly  to the skin and wash                       Gently with a scrungie or clean washcloth.  5.  Apply the CHG Soap to your  body ONLY FROM THE NECK DOWN.   Do not use on face/ open                           Wound or open sores. Avoid contact with eyes, ears mouth and genitals (private parts).                       Wash face,  Genitals (private parts) with your normal soap.             6.  Wash thoroughly, paying special attention to the area where your surgery  will be performed.  7.  Thoroughly rinse your body with warm water from the neck down.  8.  DO NOT shower/wash with your normal soap after using and rinsing off  the CHG Soap.                9.  Pat yourself dry with a clean towel.            10.  Wear clean pajamas.            11.  Place clean sheets on your bed the night of your first shower and do not  sleep with pets. Day of Surgery : Do not apply any lotions/deodorants the morning of surgery.  Please wear clean clothes to the hospital/surgery center.  FAILURE TO FOLLOW THESE INSTRUCTIONS MAY RESULT IN THE CANCELLATION OF YOUR SURGERY PATIENT SIGNATURE_________________________________  NURSE SIGNATURE__________________________________  ________________________________________________________________________

## 2015-02-24 NOTE — Progress Notes (Signed)
Surgery on 02/26/2015.  Preop  on 4/21 at 0900am. Need orders in EPIC.  Thank  You.

## 2015-02-25 ENCOUNTER — Encounter (HOSPITAL_COMMUNITY): Payer: Self-pay

## 2015-02-25 ENCOUNTER — Ambulatory Visit: Payer: Self-pay | Admitting: Surgery

## 2015-02-25 ENCOUNTER — Encounter (HOSPITAL_COMMUNITY)
Admission: RE | Admit: 2015-02-25 | Discharge: 2015-02-25 | Disposition: A | Discharge status: Home / Self Care | Payer: BLUE CROSS/BLUE SHIELD | Source: Ambulatory Visit | Attending: Surgery | Admitting: Surgery

## 2015-02-25 DIAGNOSIS — K219 Gastro-esophageal reflux disease without esophagitis: Secondary | ICD-10-CM | POA: Diagnosis not present

## 2015-02-25 DIAGNOSIS — F419 Anxiety disorder, unspecified: Secondary | ICD-10-CM | POA: Diagnosis not present

## 2015-02-25 DIAGNOSIS — Z888 Allergy status to other drugs, medicaments and biological substances status: Secondary | ICD-10-CM | POA: Diagnosis not present

## 2015-02-25 DIAGNOSIS — Z8673 Personal history of transient ischemic attack (TIA), and cerebral infarction without residual deficits: Secondary | ICD-10-CM | POA: Diagnosis not present

## 2015-02-25 DIAGNOSIS — Q211 Atrial septal defect: Secondary | ICD-10-CM | POA: Diagnosis not present

## 2015-02-25 DIAGNOSIS — Z7982 Long term (current) use of aspirin: Secondary | ICD-10-CM | POA: Diagnosis not present

## 2015-02-25 DIAGNOSIS — K4090 Unilateral inguinal hernia, without obstruction or gangrene, not specified as recurrent: Secondary | ICD-10-CM | POA: Diagnosis not present

## 2015-02-25 DIAGNOSIS — L905 Scar conditions and fibrosis of skin: Secondary | ICD-10-CM | POA: Diagnosis not present

## 2015-02-25 DIAGNOSIS — K4020 Bilateral inguinal hernia, without obstruction or gangrene, not specified as recurrent: Secondary | ICD-10-CM | POA: Diagnosis not present

## 2015-02-25 DIAGNOSIS — G4730 Sleep apnea, unspecified: Secondary | ICD-10-CM | POA: Diagnosis not present

## 2015-02-25 DIAGNOSIS — Z882 Allergy status to sulfonamides status: Secondary | ICD-10-CM | POA: Diagnosis not present

## 2015-02-25 DIAGNOSIS — Z91041 Radiographic dye allergy status: Secondary | ICD-10-CM | POA: Diagnosis not present

## 2015-02-25 DIAGNOSIS — Z7901 Long term (current) use of anticoagulants: Secondary | ICD-10-CM | POA: Diagnosis not present

## 2015-02-25 HISTORY — DX: Gastro-esophageal reflux disease without esophagitis: K21.9

## 2015-02-25 HISTORY — DX: Sleep apnea, unspecified: G47.30

## 2015-02-25 HISTORY — DX: Personal history of diseases of the skin and subcutaneous tissue: Z87.2

## 2015-02-25 HISTORY — DX: Anxiety disorder, unspecified: F41.9

## 2015-02-25 LAB — CBC
HCT: 45 % (ref 39.0–52.0)
Hemoglobin: 15.4 g/dL (ref 13.0–17.0)
MCH: 31.9 pg (ref 26.0–34.0)
MCHC: 34.2 g/dL (ref 30.0–36.0)
MCV: 93.2 fL (ref 78.0–100.0)
Platelets: 196 10*3/uL (ref 150–400)
RBC: 4.83 MIL/uL (ref 4.22–5.81)
RDW: 12.5 % (ref 11.5–15.5)
WBC: 7.4 10*3/uL (ref 4.0–10.5)

## 2015-02-25 LAB — BASIC METABOLIC PANEL
Anion gap: 6 (ref 5–15)
BUN: 16 mg/dL (ref 6–23)
CO2: 25 mmol/L (ref 19–32)
CREATININE: 1.06 mg/dL (ref 0.50–1.35)
Calcium: 9.1 mg/dL (ref 8.4–10.5)
Chloride: 107 mmol/L (ref 96–112)
GFR calc Af Amer: >90 mL/min (ref >90)
GFR calc non Af Amer: 81 mL/min — ABNORMAL LOW (ref >90)
Glucose, Bld: 93 mg/dL (ref 70–99)
Potassium: 3.9 mmol/L (ref 3.5–5.1)
Sodium: 138 mmol/L (ref 135–145)

## 2015-02-25 NOTE — Anesthesia Preprocedure Evaluation (Addendum)
Anesthesia Evaluation  Patient identified by MRN, date of birth, ID band Patient awake    Reviewed: Allergy & Precautions, H&P , NPO status , Patient's Chart, lab work & pertinent test results  Airway Mallampati: III  TM Distance: >3 FB Neck ROM: full    Dental no notable dental hx. (+) Teeth Intact, Dental Advisory Given   Pulmonary sleep apnea ,  Mild OSA breath sounds clear to auscultation  Pulmonary exam normal       Cardiovascular Exercise Tolerance: Good negative cardio ROS  Rhythm:regular Rate:Normal  PFO. Normal treadmill   Neuro/Psych Anxiety TIAnegative psych ROS   GI/Hepatic negative GI ROS, Neg liver ROS,   Endo/Other  negative endocrine ROS  Renal/GU negative Renal ROS  negative genitourinary   Musculoskeletal   Abdominal   Peds  Hematology negative hematology ROS (+)   Anesthesia Other Findings   Reproductive/Obstetrics negative OB ROS                            Anesthesia Physical Anesthesia Plan  ASA: III  Anesthesia Plan: General   Post-op Pain Management:    Induction: Intravenous  Airway Management Planned: LMA  Additional Equipment:   Intra-op Plan:   Post-operative Plan:   Informed Consent: I have reviewed the patients History and Physical, chart, labs and discussed the procedure including the risks, benefits and alternatives for the proposed anesthesia with the patient or authorized representative who has indicated his/her understanding and acceptance.   Dental Advisory Given  Plan Discussed with: CRNA and Surgeon  Anesthesia Plan Comments:         Anesthesia Quick Evaluation

## 2015-02-25 NOTE — Progress Notes (Signed)
CT chest- 07/01/2014   LOV- 6//15 EPIC - Dr Marlou Porch  EKG- 04/07/2014 EPIC  Stress Test- 04/17/14 EPIC

## 2015-02-25 NOTE — Progress Notes (Signed)
[facility] to obtain orders for 02/26/2015 surgery. Informed that Dr Hassell Done is off today. Triage desk will send message to Dr Earlie Server nurse. Patient to arrive 0530 02/26/2015.

## 2015-02-25 NOTE — H&P (Signed)
Chief Complaint:  Bilateral inguinal herniae with right being more symptomatic and midline scar  History of Present Illness:  [name] is an 49 y.o. male who had a difficult time with diverticulitis and surgery and has a midline wound that healed secondarily.  He has bilateral inguinal hernia that is most symptomatic on the right and he wants that repaired.    Past Medical History  Diagnosis Date  . Diverticulitis   . TIA (transient ischemic attack)   . PFO (patent foramen ovale)   . Chest pain   . PFO (patent foramen ovale)   . Sleep apnea     mild sleep apnea   . Anxiety   . GERD (gastroesophageal reflux disease)   . History of cold urticaria     Past Surgical History  Procedure Laterality Date  . Colon surgery    . Mouth surgery      Current Outpatient Prescriptions  Medication Sig Dispense Refill  . acetaminophen (TYLENOL) 500 MG tablet Take 1,000 mg by mouth every 6 (six) hours as needed for mild pain.    Marland Kitchen aspirin EC 81 MG tablet Take 81 mg by mouth every evening.     Marland Kitchen b complex vitamins tablet Take 1 tablet by mouth every evening.     . calcium carbonate (TUMS - DOSED IN MG ELEMENTAL CALCIUM) 500 MG chewable tablet Chew 1 tablet by mouth daily. As needed for heartburn    . cholecalciferol (VITAMIN D) 1000 UNITS tablet Take 1,000 Units by mouth daily.    . Fish Oil OIL Take 1 capsule by mouth daily.     Marland Kitchen ibuprofen (ADVIL,MOTRIN) 200 MG tablet Take 200 mg by mouth every 6 (six) hours as needed for mild pain.    . Magnesium 500 MG TABS Take 1 tablet by mouth daily.    . Multiple Vitamin (MULTIVITAMIN WITH MINERALS) TABS tablet Take 1 tablet by mouth daily.    Marland Kitchen omeprazole (PRILOSEC) 20 MG capsule Take 1 capsule (20 mg total) by mouth daily. (Patient not taking: Reported on 02/22/2015) 30 capsule 1  . psyllium (REGULOID) 0.52 G capsule Take 0.52 g by mouth daily.     No current facility-administered medications for this visit.   Contrast media; Other; and Sulfa  drugs cross reactors No family history on file. Social History:   reports that he has never smoked. He has never used smokeless tobacco. He reports that he drinks alcohol. He reports that he does not use illicit drugs.   REVIEW OF SYSTEMS : Negative except for see problem list   Physical Exam:   There were no vitals taken for this visit. There is no weight on file to calculate BMI.  Gen:  WDWN WM NAD  Neurological: Alert and oriented to person, place, and time. Motor and sensory function is grossly intact  Head: Normocephalic and atraumatic.  Eyes: Conjunctivae are normal. Pupils are equal, round, and reactive to light. No scleral icterus.  Neck: Normal range of motion. Neck supple. No tracheal deviation or thyromegaly present.  Cardiovascular:  SR without murmurs or gallops.  No carotid bruits Breast:  Not examined Respiratory: Effort normal.  No respiratory distress. No chest wall tenderness. Breath sounds normal.  No wheezes, rales or rhonchi.  Abdomen:  Bilateral herniae in the inguinal region with midline cleft GU:  nl Musculoskeletal: Normal range of motion. Extremities are nontender. No cyanosis, edema or clubbing noted Lymphadenopathy: No cervical, preauricular, postauricular or axillary adenopathy is present Skin: Skin is warm and  dry. No rash noted. No diaphoresis. No erythema. No pallor. Pscyh: Normal mood and affect. Behavior is normal. Judgment and thought content normal.   LABORATORY RESULTS: Results for orders placed or performed during the hospital encounter of 02/25/15 (from the past 48 hour(s))  CBC     Status: None   Collection Time: 02/25/15  9:30 AM  Result Value Ref Range   WBC 7.4 4.0 - 10.5 K/uL   RBC 4.83 4.22 - 5.81 MIL/uL   Hemoglobin 15.4 13.0 - 17.0 g/dL   HCT 45.0 39.0 - 52.0 %   MCV 93.2 78.0 - 100.0 fL   MCH 31.9 26.0 - 34.0 pg   MCHC 34.2 30.0 - 36.0 g/dL   RDW 12.5 11.5 - 15.5 %   Platelets 196 150 - 400 K/uL  Basic metabolic panel     Status:  Abnormal   Collection Time: 02/25/15  9:30 AM  Result Value Ref Range   Sodium 138 135 - 145 mmol/L   Potassium 3.9 3.5 - 5.1 mmol/L   Chloride 107 96 - 112 mmol/L   CO2 25 19 - 32 mmol/L   Glucose, Bld 93 70 - 99 mg/dL   BUN 16 6 - 23 mg/dL   Creatinine, Ser 1.06 0.50 - 1.35 mg/dL   Calcium 9.1 8.4 - 10.5 mg/dL   GFR calc non Af Amer 81 (L) >90 mL/min   GFR calc Af Amer >90 >90 mL/min    Comment: (NOTE) The eGFR has been calculated using the CKD EPI equation. This calculation has not been validated in all clinical situations. eGFR's persistently <90 mL/min signify possible Chronic Kidney Disease.    Anion gap 6 5 - 15     RADIOLOGY RESULTS: No results found.  Problem List: Patient Active Problem List   Diagnosis Date Noted  . Chest pain 04/07/2014    Assessment & Plan: Symptomatic RIH and midline scar .  Plan repair hernia and revise scar.      Matt B. Hassell Done, MD, [facility] [phone] beeper [phone]  02/25/2015 9:56 PM

## 2015-02-26 ENCOUNTER — Encounter (HOSPITAL_COMMUNITY): Payer: Self-pay | Admitting: *Deleted

## 2015-02-26 ENCOUNTER — Ambulatory Visit (HOSPITAL_COMMUNITY)
Admission: RE | Admit: 2015-02-26 | Discharge: 2015-02-26 | Disposition: A | Discharge status: Home / Self Care | Payer: BLUE CROSS/BLUE SHIELD | Source: Ambulatory Visit | Attending: Surgery | Admitting: Surgery

## 2015-02-26 ENCOUNTER — Ambulatory Visit (HOSPITAL_COMMUNITY): Payer: BLUE CROSS/BLUE SHIELD | Admitting: Anesthesiology

## 2015-02-26 ENCOUNTER — Encounter (HOSPITAL_COMMUNITY)
Admission: RE | Disposition: A | Discharge status: Home / Self Care | Payer: Self-pay | Source: Ambulatory Visit | Attending: Surgery

## 2015-02-26 DIAGNOSIS — Z882 Allergy status to sulfonamides status: Secondary | ICD-10-CM | POA: Insufficient documentation

## 2015-02-26 DIAGNOSIS — Z888 Allergy status to other drugs, medicaments and biological substances status: Secondary | ICD-10-CM | POA: Insufficient documentation

## 2015-02-26 DIAGNOSIS — K219 Gastro-esophageal reflux disease without esophagitis: Secondary | ICD-10-CM | POA: Insufficient documentation

## 2015-02-26 DIAGNOSIS — Z7982 Long term (current) use of aspirin: Secondary | ICD-10-CM | POA: Insufficient documentation

## 2015-02-26 DIAGNOSIS — F419 Anxiety disorder, unspecified: Secondary | ICD-10-CM | POA: Insufficient documentation

## 2015-02-26 DIAGNOSIS — Q211 Atrial septal defect: Secondary | ICD-10-CM | POA: Insufficient documentation

## 2015-02-26 DIAGNOSIS — K4090 Unilateral inguinal hernia, without obstruction or gangrene, not specified as recurrent: Secondary | ICD-10-CM | POA: Diagnosis not present

## 2015-02-26 DIAGNOSIS — G4730 Sleep apnea, unspecified: Secondary | ICD-10-CM | POA: Insufficient documentation

## 2015-02-26 DIAGNOSIS — K4020 Bilateral inguinal hernia, without obstruction or gangrene, not specified as recurrent: Secondary | ICD-10-CM | POA: Insufficient documentation

## 2015-02-26 DIAGNOSIS — L905 Scar conditions and fibrosis of skin: Secondary | ICD-10-CM | POA: Insufficient documentation

## 2015-02-26 DIAGNOSIS — Z91041 Radiographic dye allergy status: Secondary | ICD-10-CM | POA: Insufficient documentation

## 2015-02-26 DIAGNOSIS — Z8673 Personal history of transient ischemic attack (TIA), and cerebral infarction without residual deficits: Secondary | ICD-10-CM | POA: Insufficient documentation

## 2015-02-26 DIAGNOSIS — Z7901 Long term (current) use of anticoagulants: Secondary | ICD-10-CM | POA: Insufficient documentation

## 2015-02-26 HISTORY — PX: SCAR REVISION: SHX5285

## 2015-02-26 HISTORY — PX: INGUINAL HERNIA REPAIR: SHX194

## 2015-02-26 SURGERY — REPAIR, HERNIA, INGUINAL, ADULT
Anesthesia: General | Site: Groin | Laterality: Right

## 2015-02-26 MED ORDER — FENTANYL CITRATE (PF) 100 MCG/2ML IJ SOLN
25.0000 ug | INTRAMUSCULAR | Status: DC | PRN
  Administered 2015-02-26 (×2): 50 ug via INTRAVENOUS

## 2015-02-26 MED ORDER — EPHEDRINE SULFATE 50 MG/ML IJ SOLN
INTRAMUSCULAR | Status: DC | PRN
  Administered 2015-02-26 (×3): 10 mg via INTRAVENOUS

## 2015-02-26 MED ORDER — MIDAZOLAM HCL 5 MG/5ML IJ SOLN
INTRAMUSCULAR | Status: DC | PRN
  Administered 2015-02-26: 2 mg via INTRAVENOUS

## 2015-02-26 MED ORDER — FENTANYL CITRATE (PF) 100 MCG/2ML IJ SOLN
INTRAMUSCULAR | Status: AC
  Filled 2015-02-26: qty 2

## 2015-02-26 MED ORDER — KETOROLAC TROMETHAMINE 15 MG/ML IJ SOLN
INTRAMUSCULAR | Status: DC | PRN
  Administered 2015-02-26: 15 mg via INTRAVENOUS

## 2015-02-26 MED ORDER — SODIUM CHLORIDE 0.9 % IJ SOLN
INTRAMUSCULAR | Status: DC | PRN
  Administered 2015-02-26: 10 mL via INTRAVENOUS

## 2015-02-26 MED ORDER — EPHEDRINE SULFATE 50 MG/ML IJ SOLN
INTRAMUSCULAR | Status: AC
  Filled 2015-02-26: qty 1

## 2015-02-26 MED ORDER — CEFAZOLIN SODIUM-DEXTROSE 2-3 GM-% IV SOLR
INTRAVENOUS | Status: AC
  Filled 2015-02-26: qty 50

## 2015-02-26 MED ORDER — HYDROCODONE-ACETAMINOPHEN 5-325 MG PO TABS: 1.0000 | ORAL_TABLET | ORAL | Status: DC | PRN

## 2015-02-26 MED ORDER — HEPARIN SODIUM (PORCINE) 5000 UNIT/ML IJ SOLN
5000.0000 [IU] | Freq: Once | INTRAMUSCULAR | Status: AC
  Administered 2015-02-26: 5000 [IU] via SUBCUTANEOUS
  Filled 2015-02-26: qty 1

## 2015-02-26 MED ORDER — ACETAMINOPHEN 650 MG RE SUPP: 650.0000 mg | RECTAL | Status: DC | PRN

## 2015-02-26 MED ORDER — ACETAMINOPHEN 325 MG PO TABS: 650.0000 mg | ORAL_TABLET | ORAL | Status: DC | PRN

## 2015-02-26 MED ORDER — FENTANYL CITRATE (PF) 100 MCG/2ML IJ SOLN
INTRAMUSCULAR | Status: DC | PRN
  Administered 2015-02-26 (×6): 50 ug via INTRAVENOUS

## 2015-02-26 MED ORDER — LIDOCAINE HCL (CARDIAC) 20 MG/ML IV SOLN
INTRAVENOUS | Status: AC
  Filled 2015-02-26: qty 5

## 2015-02-26 MED ORDER — SODIUM CHLORIDE 0.9 % IJ SOLN
INTRAMUSCULAR | Status: AC
  Filled 2015-02-26: qty 10

## 2015-02-26 MED ORDER — PROPOFOL 10 MG/ML IV BOLUS
INTRAVENOUS | Status: AC
  Filled 2015-02-26: qty 20

## 2015-02-26 MED ORDER — GLYCOPYRROLATE 0.2 MG/ML IJ SOLN
INTRAMUSCULAR | Status: AC
  Filled 2015-02-26: qty 1

## 2015-02-26 MED ORDER — LACTATED RINGERS IV SOLN
INTRAVENOUS | Status: DC | PRN
  Administered 2015-02-26: 07:00:00 via INTRAVENOUS

## 2015-02-26 MED ORDER — SODIUM CHLORIDE 0.9 % IJ SOLN: 3.0000 mL | INTRAMUSCULAR | Status: DC | PRN

## 2015-02-26 MED ORDER — BUPIVACAINE LIPOSOME 1.3 % IJ SUSP
INTRAMUSCULAR | Status: DC | PRN
  Administered 2015-02-26: 20 mL

## 2015-02-26 MED ORDER — ACETAMINOPHEN 10 MG/ML IV SOLN
1000.0000 mg | INTRAVENOUS | Status: AC
  Administered 2015-02-26: 1000 mg via INTRAVENOUS
  Filled 2015-02-26: qty 100

## 2015-02-26 MED ORDER — SODIUM CHLORIDE 0.9 % IJ SOLN: 3.0000 mL | Freq: Two times a day (BID) | INTRAMUSCULAR | Status: DC

## 2015-02-26 MED ORDER — DEXAMETHASONE SODIUM PHOSPHATE 10 MG/ML IJ SOLN
INTRAMUSCULAR | Status: DC | PRN
  Administered 2015-02-26: 10 mg via INTRAVENOUS

## 2015-02-26 MED ORDER — LACTATED RINGERS IV SOLN
INTRAVENOUS | Status: DC
  Administered 2015-02-26: 1000 mL via INTRAVENOUS

## 2015-02-26 MED ORDER — OXYCODONE HCL 5 MG PO TABS
5.0000 mg | ORAL_TABLET | ORAL | Status: DC | PRN
  Administered 2015-02-26: 5 mg via ORAL
  Filled 2015-02-26 (×2): qty 1

## 2015-02-26 MED ORDER — CHLORHEXIDINE GLUCONATE 4 % EX LIQD: 1.0000 "application " | Freq: Once | CUTANEOUS | Status: DC

## 2015-02-26 MED ORDER — CEFAZOLIN SODIUM-DEXTROSE 2-3 GM-% IV SOLR
2.0000 g | INTRAVENOUS | Status: AC
  Administered 2015-02-26: 2 g via INTRAVENOUS

## 2015-02-26 MED ORDER — ONDANSETRON HCL 4 MG/2ML IJ SOLN
INTRAMUSCULAR | Status: DC | PRN
  Administered 2015-02-26: 4 mg via INTRAVENOUS

## 2015-02-26 MED ORDER — PROPOFOL 10 MG/ML IV BOLUS
INTRAVENOUS | Status: DC | PRN
  Administered 2015-02-26: 200 mg via INTRAVENOUS

## 2015-02-26 MED ORDER — ONDANSETRON HCL 4 MG/2ML IJ SOLN
INTRAMUSCULAR | Status: AC
  Filled 2015-02-26: qty 2

## 2015-02-26 MED ORDER — DEXAMETHASONE SODIUM PHOSPHATE 10 MG/ML IJ SOLN
INTRAMUSCULAR | Status: AC
  Filled 2015-02-26: qty 1

## 2015-02-26 MED ORDER — SODIUM CHLORIDE 0.9 % IV SOLN: 250.0000 mL | INTRAVENOUS | Status: DC | PRN

## 2015-02-26 MED ORDER — BUPIVACAINE-EPINEPHRINE 0.25% -1:200000 IJ SOLN
INTRAMUSCULAR | Status: AC
  Filled 2015-02-26: qty 1

## 2015-02-26 MED ORDER — GLYCOPYRROLATE 0.2 MG/ML IJ SOLN
INTRAMUSCULAR | Status: DC | PRN
  Administered 2015-02-26: 0.2 mg via INTRAVENOUS

## 2015-02-26 MED ORDER — MIDAZOLAM HCL 2 MG/2ML IJ SOLN
INTRAMUSCULAR | Status: AC
  Filled 2015-02-26: qty 2

## 2015-02-26 MED ORDER — LIDOCAINE HCL (CARDIAC) 20 MG/ML IV SOLN
INTRAVENOUS | Status: DC | PRN
  Administered 2015-02-26: 100 mg via INTRAVENOUS

## 2015-02-26 MED ORDER — KETOROLAC TROMETHAMINE 30 MG/ML IJ SOLN
INTRAMUSCULAR | Status: AC
  Filled 2015-02-26: qty 1

## 2015-02-26 MED ORDER — BUPIVACAINE LIPOSOME 1.3 % IJ SUSP
20.0000 mL | Freq: Once | INTRAMUSCULAR | Status: DC
  Filled 2015-02-26: qty 20

## 2015-02-26 SURGICAL SUPPLY — 37 items
APL SKNCLS STERI-STRIP NONHPOA (GAUZE/BANDAGES/DRESSINGS)
BENZOIN TINCTURE PRP APPL 2/3 (GAUZE/BANDAGES/DRESSINGS) IMPLANT
BINDER ABDOMINAL 12 ML 46-62 (SOFTGOODS) ×2 IMPLANT
BLADE HEX COATED 2.75 (ELECTRODE) ×4 IMPLANT
BLADE SURG 15 STRL LF DISP TIS (BLADE) ×2 IMPLANT
BLADE SURG 15 STRL SS (BLADE) ×4
CLOSURE WOUND 1/2 X4 (GAUZE/BANDAGES/DRESSINGS)
DECANTER SPIKE VIAL GLASS SM (MISCELLANEOUS) ×4 IMPLANT
DISSECTOR ROUND CHERRY 3/8 STR (MISCELLANEOUS) IMPLANT
DRAIN PENROSE 18X1/2 LTX STRL (DRAIN) ×4 IMPLANT
DRAPE LAPAROTOMY TRNSV 102X78 (DRAPE) ×4 IMPLANT
ELECT REM PT RETURN 9FT ADLT (ELECTROSURGICAL) ×4
ELECTRODE REM PT RTRN 9FT ADLT (ELECTROSURGICAL) ×2 IMPLANT
GLOVE BIOGEL M 8.0 STRL (GLOVE) ×4 IMPLANT
GOWN STRL REUS W/TWL XL LVL3 (GOWN DISPOSABLE) ×10 IMPLANT
KIT BASIN OR (CUSTOM PROCEDURE TRAY) ×4 IMPLANT
LIQUID BAND (GAUZE/BANDAGES/DRESSINGS) ×4 IMPLANT
MESH HERNIA 3X6 (Mesh General) ×2 IMPLANT
NEEDLE HYPO 22GX1.5 SAFETY (NEEDLE) ×4 IMPLANT
PACK BASIC VI WITH GOWN DISP (CUSTOM PROCEDURE TRAY) ×4 IMPLANT
PENCIL BUTTON HOLSTER BLD 10FT (ELECTRODE) ×4 IMPLANT
SPONGE LAP 4X18 X RAY DECT (DISPOSABLE) ×6 IMPLANT
STAPLER VISISTAT 35W (STAPLE) ×2 IMPLANT
STRIP CLOSURE SKIN 1/2X4 (GAUZE/BANDAGES/DRESSINGS) IMPLANT
SUT MON AB 5-0 PS2 18 (SUTURE) ×2 IMPLANT
SUT PROLENE 2 0 CT2 30 (SUTURE) ×10 IMPLANT
SUT SILK 2 0 SH (SUTURE) IMPLANT
SUT VIC AB 2-0 SH 27 (SUTURE) ×4
SUT VIC AB 2-0 SH 27X BRD (SUTURE) ×2 IMPLANT
SUT VIC AB 3-0 SH 18 (SUTURE) ×2 IMPLANT
SUT VIC AB 4-0 SH 18 (SUTURE) ×4 IMPLANT
SUT VICRYL 4-0 (SUTURE) ×2 IMPLANT
SYR 20CC LL (SYRINGE) ×4 IMPLANT
SYR BULB IRRIGATION 50ML (SYRINGE) ×4 IMPLANT
TOWEL OR 17X26 10 PK STRL BLUE (TOWEL DISPOSABLE) ×4 IMPLANT
TOWEL OR NON WOVEN STRL DISP B (DISPOSABLE) ×4 IMPLANT
YANKAUER SUCT BULB TIP 10FT TU (MISCELLANEOUS) ×4 IMPLANT

## 2015-02-26 NOTE — Anesthesia Procedure Notes (Signed)
Procedure Name: LMA Insertion Date/Time: 02/26/2015 7:32 AM Performed by: Maxwell Caul Pre-anesthesia Checklist: Patient identified, Emergency Drugs available, Suction available and Patient being monitored Patient Re-evaluated:Patient Re-evaluated prior to inductionOxygen Delivery Method: Circle system utilized Preoxygenation: Pre-oxygenation with 100% oxygen Intubation Type: IV induction LMA: LMA inserted LMA Size: 5.0 Tube secured with: Tape Dental Injury: Teeth and Oropharynx as per pre-operative assessment

## 2015-02-26 NOTE — Interval H&P Note (Signed)
History and Physical Interval Note:  02/26/2015 7:21 AM  [name]  has presented today for surgery, with the diagnosis of RIGHT INGUINAL HERNIA AND MIDLINE SCAR  The various methods of treatment have been discussed with the patient and family. After consideration of risks, benefits and other options for treatment, the patient has consented to  Procedure(s): OPEN RIGHT INGUINAL HERNIA REPAIR  (Right) [place] (N/A) as a surgical intervention .  The patient's history has been reviewed, patient examined, no change in status, stable for surgery.  I have reviewed the patient's chart and labs.  Questions were answered to the patient's satisfaction.     [name] B

## 2015-02-26 NOTE — Op Note (Signed)
Surgeon: Kaylyn Lim, MD, FACS  Asst:  none  Anes:  General by LMA  Procedure: Open right inguinal hernia with mesh (direct hernia); midline scar excision and primary closure  Diagnosis: Right direct inguinal hernia;  Scar discomfort  Complications: none  EBL:   10 cc  Drains: none  Description of Procedure:  The patient was taken to OR 11 at [facility].  After anesthesia was administered and the patient was prepped a timeout was performed.  The patient had been marked on the right side and an oblique incision was made.  This was carried down to the external oblique which were incised along the fibers to open up the external ring.  The cord was mobilized and was easily separated from the prominent direct hernia that occupied the floor of the canal.  No indirect hernia was seen upon examination of the cord structures.  A relaxing incision was made medially and the floor was repaired directly with running 2-0 prolene.  Marlex type mesh was then cut to fit the inguinal floor and was sewn to the inguinal ligament and medially to the internal oblique.  The mesh was wrapped around the cord and sewn to itself with a single horizontal mattress of 2-0 prolene.  The area was infiltrated with Exparel.  The external oblique was closed with 2-0 vicryl.  4-0 vicryl was used to close the subcutaneous layer and then the skin was closed with 5-0 monocryl.  Liquiband was used on the skin.    The midline scar was incised and then excised by separating the skin from the underlying scar below.  The edges were then undermined with the bovie for about 1.5 cm and then closed with 3-0 vicryl and running 5-0 monocryl.  Liquiband was used on the skin.    The patient tolerated the procedure well and was taken to the PACU in stable condition.     Matt B. Hassell Done, [place], [facility], [place]

## 2015-02-26 NOTE — Progress Notes (Signed)
Dr. Martin in to see patient

## 2015-02-26 NOTE — H&P (View-Only) (Signed)
Chief Complaint:  Bilateral inguinal herniae with right being more symptomatic and midline scar  History of Present Illness:  [name] is an 49 y.o. male who had a difficult time with diverticulitis and surgery and has a midline wound that healed secondarily.  He has bilateral inguinal hernia that is most symptomatic on the right and he wants that repaired.    Past Medical History  Diagnosis Date  . Diverticulitis   . TIA (transient ischemic attack)   . PFO (patent foramen ovale)   . Chest pain   . PFO (patent foramen ovale)   . Sleep apnea     mild sleep apnea   . Anxiety   . GERD (gastroesophageal reflux disease)   . History of cold urticaria     Past Surgical History  Procedure Laterality Date  . Colon surgery    . Mouth surgery      Current Outpatient Prescriptions  Medication Sig Dispense Refill  . acetaminophen (TYLENOL) 500 MG tablet Take 1,000 mg by mouth every 6 (six) hours as needed for mild pain.    Marland Kitchen aspirin EC 81 MG tablet Take 81 mg by mouth every evening.     Marland Kitchen b complex vitamins tablet Take 1 tablet by mouth every evening.     . calcium carbonate (TUMS - DOSED IN MG ELEMENTAL CALCIUM) 500 MG chewable tablet Chew 1 tablet by mouth daily. As needed for heartburn    . cholecalciferol (VITAMIN D) 1000 UNITS tablet Take 1,000 Units by mouth daily.    . Fish Oil OIL Take 1 capsule by mouth daily.     Marland Kitchen ibuprofen (ADVIL,MOTRIN) 200 MG tablet Take 200 mg by mouth every 6 (six) hours as needed for mild pain.    . Magnesium 500 MG TABS Take 1 tablet by mouth daily.    . Multiple Vitamin (MULTIVITAMIN WITH MINERALS) TABS tablet Take 1 tablet by mouth daily.    Marland Kitchen omeprazole (PRILOSEC) 20 MG capsule Take 1 capsule (20 mg total) by mouth daily. (Patient not taking: Reported on 02/22/2015) 30 capsule 1  . psyllium (REGULOID) 0.52 G capsule Take 0.52 g by mouth daily.     No current facility-administered medications for this visit.   Contrast media; Other; and Sulfa  drugs cross reactors No family history on file. Social History:   reports that he has never smoked. He has never used smokeless tobacco. He reports that he drinks alcohol. He reports that he does not use illicit drugs.   REVIEW OF SYSTEMS : Negative except for see problem list   Physical Exam:   There were no vitals taken for this visit. There is no weight on file to calculate BMI.  Gen:  WDWN WM NAD  Neurological: Alert and oriented to person, place, and time. Motor and sensory function is grossly intact  Head: Normocephalic and atraumatic.  Eyes: Conjunctivae are normal. Pupils are equal, round, and reactive to light. No scleral icterus.  Neck: Normal range of motion. Neck supple. No tracheal deviation or thyromegaly present.  Cardiovascular:  SR without murmurs or gallops.  No carotid bruits Breast:  Not examined Respiratory: Effort normal.  No respiratory distress. No chest wall tenderness. Breath sounds normal.  No wheezes, rales or rhonchi.  Abdomen:  Bilateral herniae in the inguinal region with midline cleft GU:  nl Musculoskeletal: Normal range of motion. Extremities are nontender. No cyanosis, edema or clubbing noted Lymphadenopathy: No cervical, preauricular, postauricular or axillary adenopathy is present Skin: Skin is warm and  dry. No rash noted. No diaphoresis. No erythema. No pallor. Pscyh: Normal mood and affect. Behavior is normal. Judgment and thought content normal.   LABORATORY RESULTS: Results for orders placed or performed during the hospital encounter of 02/25/15 (from the past 48 hour(s))  CBC     Status: None   Collection Time: 02/25/15  9:30 AM  Result Value Ref Range   WBC 7.4 4.0 - 10.5 K/uL   RBC 4.83 4.22 - 5.81 MIL/uL   Hemoglobin 15.4 13.0 - 17.0 g/dL   HCT 45.0 39.0 - 52.0 %   MCV 93.2 78.0 - 100.0 fL   MCH 31.9 26.0 - 34.0 pg   MCHC 34.2 30.0 - 36.0 g/dL   RDW 12.5 11.5 - 15.5 %   Platelets 196 150 - 400 K/uL  Basic metabolic panel     Status:  Abnormal   Collection Time: 02/25/15  9:30 AM  Result Value Ref Range   Sodium 138 135 - 145 mmol/L   Potassium 3.9 3.5 - 5.1 mmol/L   Chloride 107 96 - 112 mmol/L   CO2 25 19 - 32 mmol/L   Glucose, Bld 93 70 - 99 mg/dL   BUN 16 6 - 23 mg/dL   Creatinine, Ser 1.06 0.50 - 1.35 mg/dL   Calcium 9.1 8.4 - 10.5 mg/dL   GFR calc non Af Amer 81 (L) >90 mL/min   GFR calc Af Amer >90 >90 mL/min    Comment: (NOTE) The eGFR has been calculated using the CKD EPI equation. This calculation has not been validated in all clinical situations. eGFR's persistently <90 mL/min signify possible Chronic Kidney Disease.    Anion gap 6 5 - 15     RADIOLOGY RESULTS: No results found.  Problem List: Patient Active Problem List   Diagnosis Date Noted  . Chest pain 04/07/2014    Assessment & Plan: Symptomatic RIH and midline scar .  Plan repair hernia and revise scar.      Matt B. Hassell Done, MD, [facility] [phone] beeper [phone]  02/25/2015 9:56 PM

## 2015-02-26 NOTE — Anesthesia Postprocedure Evaluation (Signed)
  Anesthesia Post-op Note  Patient: [name]  Procedure(s) Performed: Procedure(s) (LRB): OPEN RIGHT INGUINAL HERNIA REPAIR  (Right) MIDLINE SCAR REVISION (N/A)  Patient Location: PACU  Anesthesia Type: General  Level of Consciousness: awake and alert   Airway and Oxygen Therapy: Patient Spontanous Breathing  Post-op Pain: mild  Post-op Assessment: Post-op Vital signs reviewed, Patient's Cardiovascular Status Stable, Respiratory Function Stable, Patent Airway and No signs of Nausea or vomiting  Last Vitals:  Filed Vitals:   02/26/15 0945  BP: 151/92  Pulse: 74  Temp:   Resp: 12    Post-op Vital Signs: stable   Complications: No apparent anesthesia complications

## 2015-02-26 NOTE — Discharge Instructions (Signed)
May shower and get incisions wet tomorrow Wear abdominal binder if it helps abdomen feel better.       General Anesthesia, Care After Refer to this sheet in the next few weeks. These instructions provide you with information on caring for yourself after your procedure. Your health care provider may also give you more specific instructions. Your treatment has been planned according to current medical practices, but problems sometimes occur. Call your health care provider if you have any problems or questions after your procedure. WHAT TO EXPECT AFTER THE PROCEDURE After the procedure, it is typical to experience:  Sleepiness.  Nausea and vomiting. HOME CARE INSTRUCTIONS  For the first 24 hours after general anesthesia:  Have a responsible person with you.  Do not drive a car. If you are alone, do not take public transportation.  Do not drink alcohol.  Do not take medicine that has not been prescribed by your health care provider.  Do not sign important papers or make important decisions.  You may resume a normal diet and activities as directed by your health care provider.  Change bandages (dressings) as directed.  If you have questions or problems that seem related to general anesthesia, call the hospital and ask for the anesthetist or anesthesiologist on call. SEEK MEDICAL CARE IF:  You have nausea and vomiting that continue the day after anesthesia.  You develop a rash. SEEK IMMEDIATE MEDICAL CARE IF:   You have difficulty breathing.  You have chest pain.  You have any allergic problems. Document Released: 01/29/2001 Document Revised: 10/28/2013 Document Reviewed: 05/08/2013 [facility] Patient Information 2015 [place], [place]. This information is not intended to replace advice given to you by your health care provider. Make sure you discuss any questions you have with your health care provider. Inguinal Hernia, Adult  Care After Refer to this sheet in the next few  weeks. These discharge instructions provide you with general information on caring for yourself after you leave the hospital. Your caregiver may also give you specific instructions. Your treatment has been planned according to the most current medical practices available, but unavoidable complications sometimes occur. If you have any problems or questions after discharge, please call your caregiver. HOME CARE INSTRUCTIONS  Put ice on the operative site.  Put ice in a plastic bag.  Place a towel between your skin and the bag.  Leave the ice on for 15-20 minutes at a time, 03-04 times a day while awake.  Change bandages (dressings) as directed.  Keep the wound dry and clean. The wound may be washed gently with soap and water. Gently blot or dab the wound dry. It is okay to take showers 24 to 48 hours after surgery. Do not take baths, use swimming pools, or use hot tubs for 10 days, or as directed by your caregiver.  Only take over-the-counter or prescription medicines for pain, discomfort, or fever as directed by your caregiver.  Continue your normal diet as directed.  Do not lift anything more than 10 pounds or play contact sports for 3 weeks, or as directed. SEEK MEDICAL CARE IF:  There is redness, swelling, or increasing pain in the wound.  There is fluid (pus) coming from the wound.  There is drainage from a wound lasting longer than 1 day.  You have an oral temperature above 102 F (38.9 C).  You notice a bad smell coming from the wound or dressing.  The wound breaks open after the stitches (sutures) have been removed.  You  notice increasing pain in the shoulders (shoulder strap areas).  You develop dizzy episodes or fainting while standing.  You feel sick to your stomach (nauseous) or throw up (vomit). SEEK IMMEDIATE MEDICAL CARE IF:  You develop a rash.  You have difficulty breathing.  You develop a reaction or have side effects to medicines you were given. MAKE  SURE YOU:   Understand these instructions.  Will watch your condition.  Will get help right away if you are not doing well or get worse. Document Released: 11/23/2006 Document Revised: 01/15/2012 Document Reviewed: 09/22/2009 [facility] Patient Information 2015 [place], [place]. This information is not intended to replace advice given to you by your health care provider. Make sure you discuss any questions you have with your health care provider.

## 2015-02-26 NOTE — Transfer of Care (Signed)
Immediate Anesthesia Transfer of Care Note  Patient: [name]  Procedure(s) Performed: Procedure(s): OPEN RIGHT INGUINAL HERNIA REPAIR  (Right) MIDLINE SCAR REVISION (N/A)  Patient Location: PACU  Anesthesia Type:General  Level of Consciousness:  sedated, patient cooperative and responds to stimulation  Airway & Oxygen Therapy:Patient Spontanous Breathing and Patient connected to face mask oxgen  Post-op Assessment:  Report given to PACU RN and Post -op Vital signs reviewed and stable  Post vital signs:  Reviewed and stable  Last Vitals:  Filed Vitals:   02/26/15 0552  BP: 151/97  Pulse: 67  Temp: 37.3 C  Resp: 18    Complications: No apparent anesthesia complications

## 2015-03-01 ENCOUNTER — Encounter (HOSPITAL_COMMUNITY): Payer: Self-pay | Admitting: Surgery

## 2016-04-24 IMAGING — CT CT CHEST W/O CM
3 of 4 series · 17 of 30 positions shown, 19 images · non-contrast
Comparison: Chest x-ray from 04/06/2014.

CLINICAL DATA: Mid chest pain with productive cough.

EXAM:
CT CHEST WITHOUT CONTRAST
TECHNIQUE: Multidetector CT imaging of the chest was performed following the
standard protocol without IV contrast..

[Series 3: chest w/o · axial · non-contrast · 0.70mm/px · z∈[-271,-41]mm · 5 of 70 slices shown, 7 images]
[im 12/70  mediastinal]
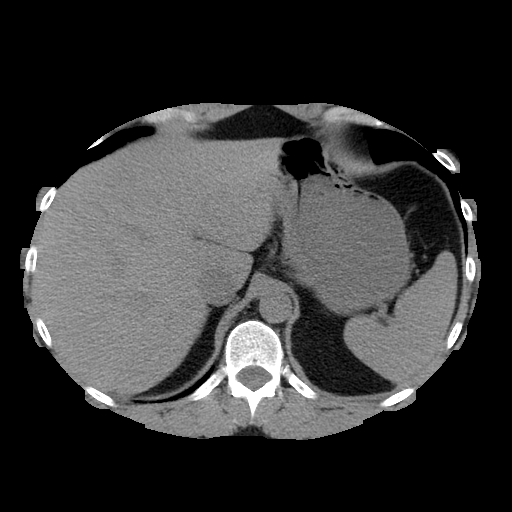
[im 12/70  lung]
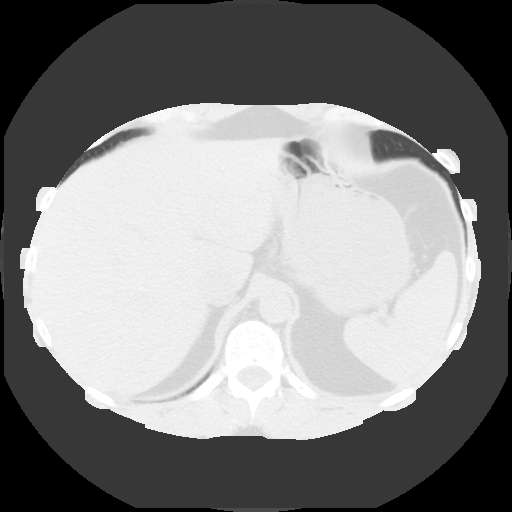
[im 24/70  lung]
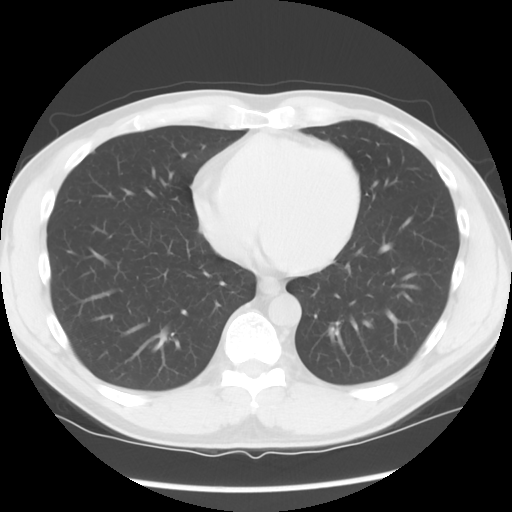
[im 35/70  lung]
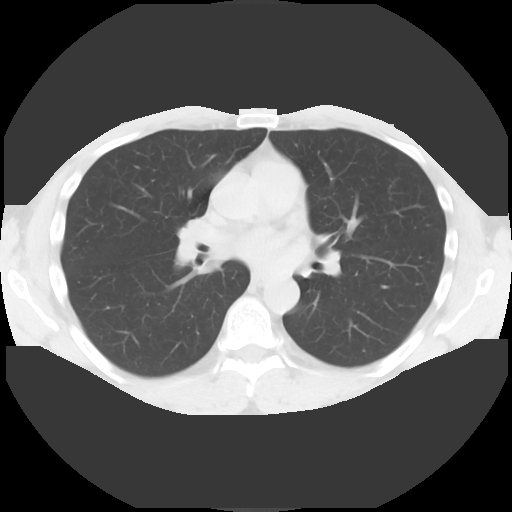
[im 47/70  lung]
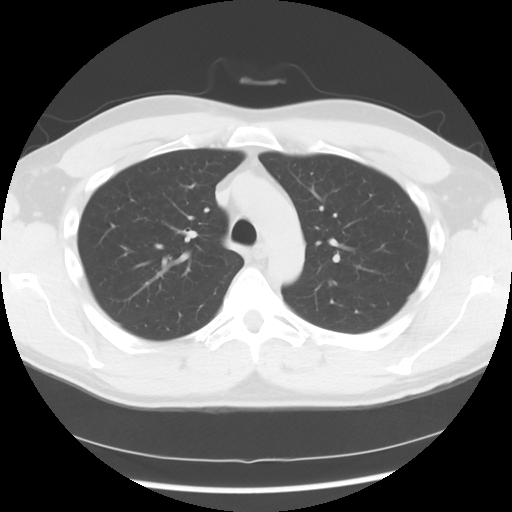
[im 58/70  mediastinal]
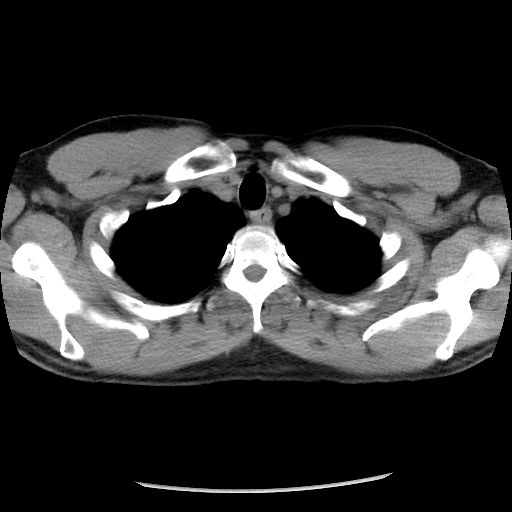
[im 58/70  lung]
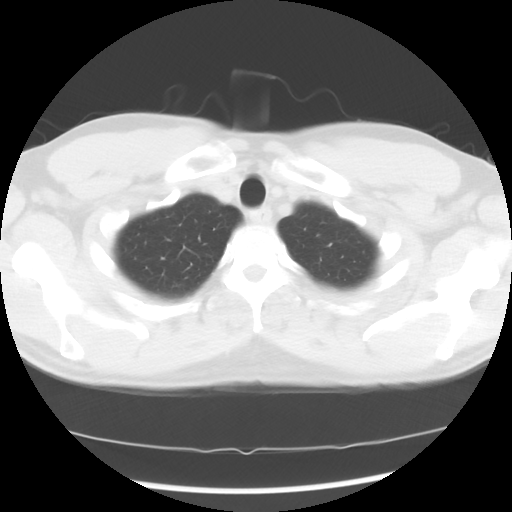

[Series 4: lung windows · axial · 0.70mm/px · z∈[-271,-41]mm · 5 of 70 slices shown]
[im 12/70  lung]
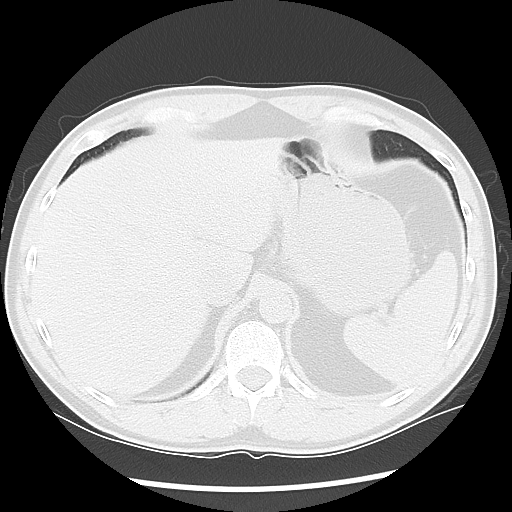
[im 24/70  lung]
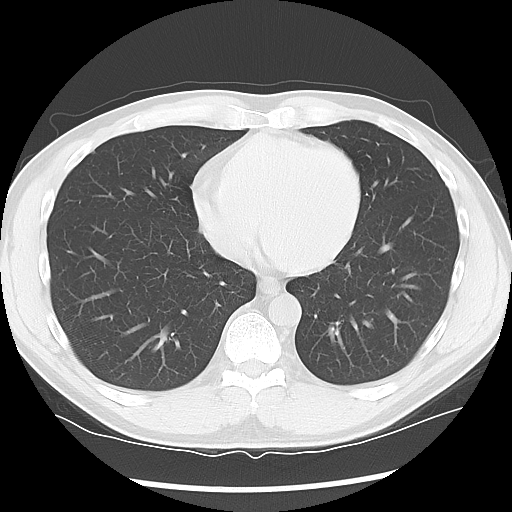
[im 35/70  lung]
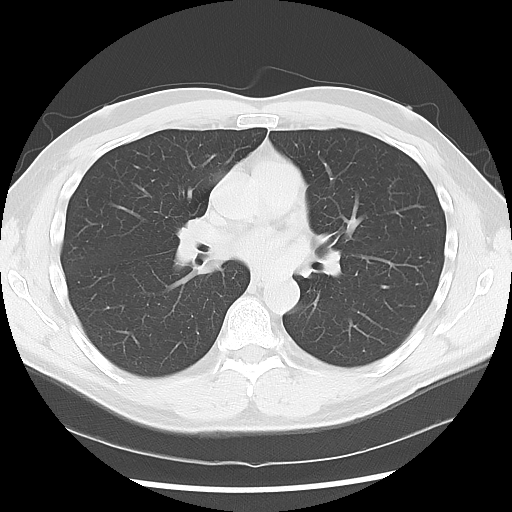
[im 47/70  lung]
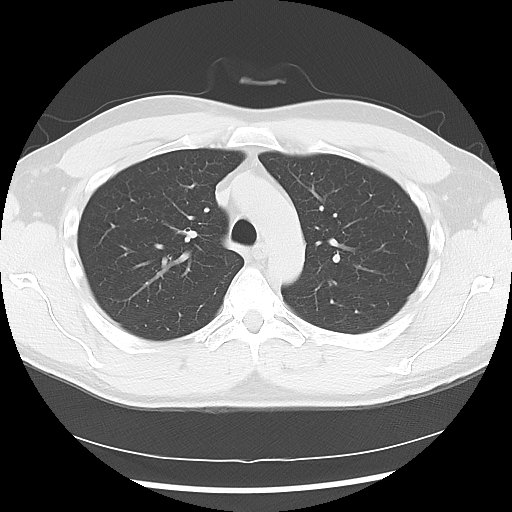
[im 58/70  lung]
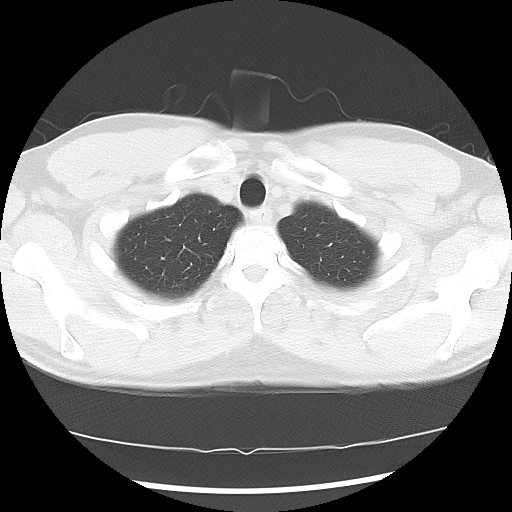

[Series 602: sagittal body · sagittal · 0.70mm/px · 7 of 145 slices shown]
[im 12/145  mediastinal]
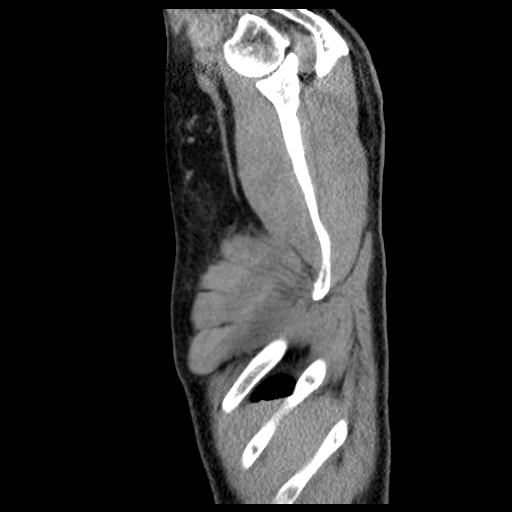
[im 34/145  mediastinal]
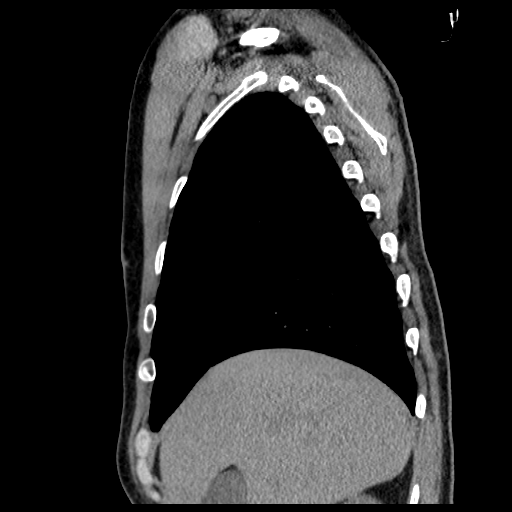
[im 45/145  mediastinal]
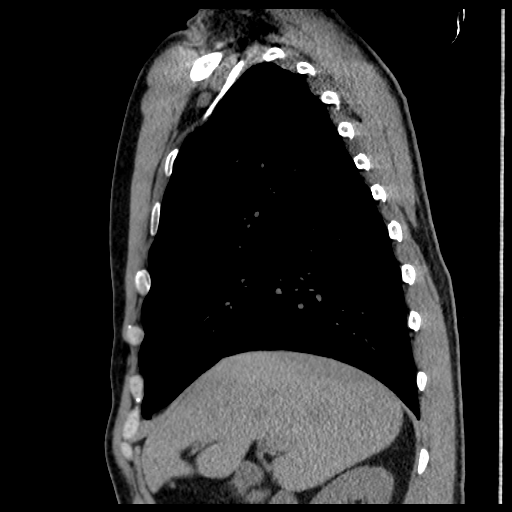
[im 67/145  mediastinal]
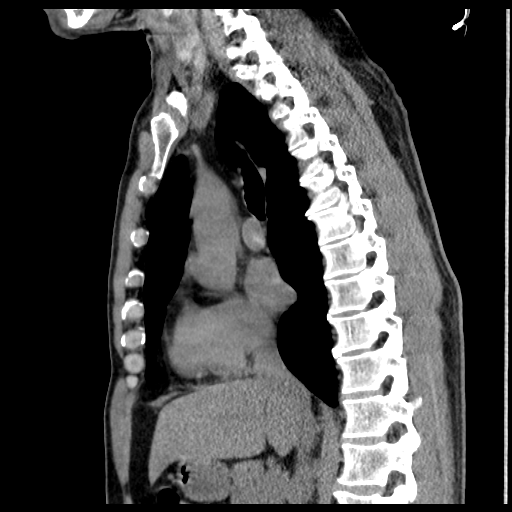
[im 78/145  mediastinal]
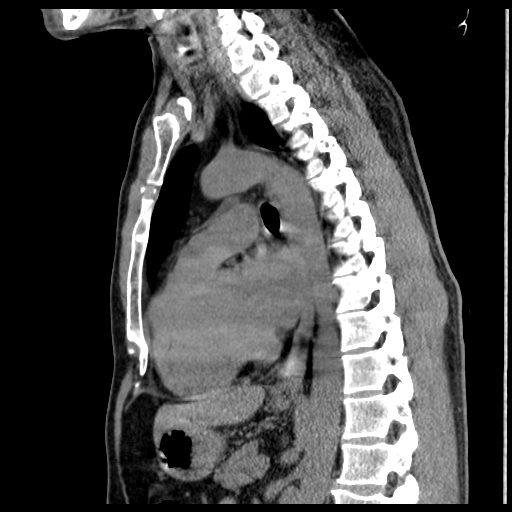
[im 100/145  mediastinal]
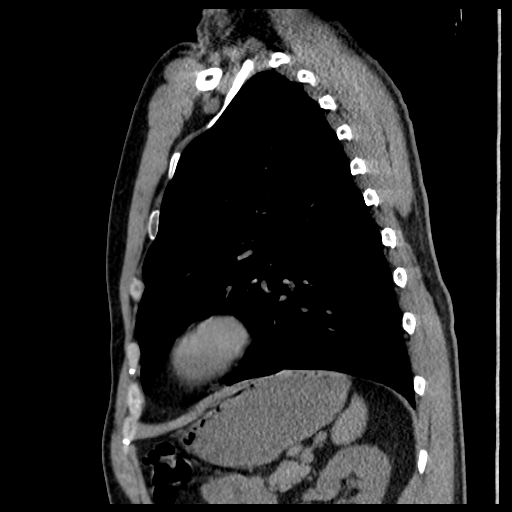
[im 111/145  mediastinal]
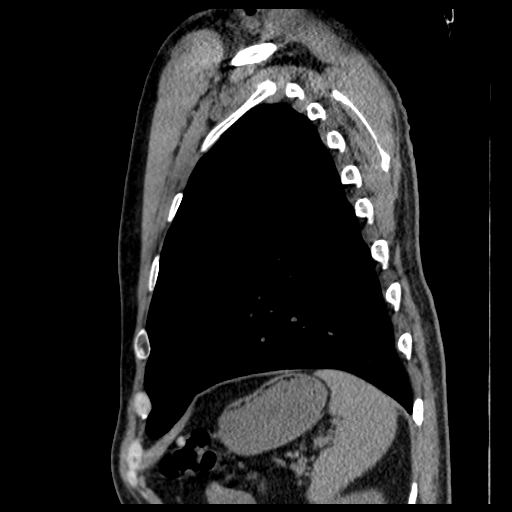

[17 of 30 positions shown; findings below may reference images not displayed]

FINDINGS: Soft tissue / Mediastinum: There is no axillary lymphadenopathy. No
mediastinal or hilar lymphadenopathy. The thoracic esophagus is
unremarkable. Heart size is normal. No pericardial effusion.

Lungs / Pleura: Trachea and mainstem bronchi are normal. The lungs
are clear bilaterally without edema or focal airspace consolidation.
There is no pulmonary parenchymal nodule or mass. No evidence for
bronchiectasis.

Bones: Bone windows reveal no worrisome lytic or sclerotic osseous
lesions.

Upper Abdomen:  Unremarkable.
IMPRESSION: Normal CT evaluation of the chest. Specifically, no findings to
explain the patient's history of chest pain and cough.

## 2018-10-30 ENCOUNTER — Emergency Department (HOSPITAL_COMMUNITY)
Admission: EM | Admit: 2018-10-30 | Discharge: 2018-10-30 | Disposition: A | Discharge status: Home / Self Care | Payer: BLUE CROSS/BLUE SHIELD | Attending: Emergency Medicine | Admitting: Emergency Medicine

## 2018-10-30 ENCOUNTER — Emergency Department (HOSPITAL_COMMUNITY): Payer: BLUE CROSS/BLUE SHIELD

## 2018-10-30 ENCOUNTER — Other Ambulatory Visit: Payer: Self-pay

## 2018-10-30 ENCOUNTER — Encounter (HOSPITAL_COMMUNITY): Payer: Self-pay | Admitting: Emergency Medicine

## 2018-10-30 DIAGNOSIS — K5732 Diverticulitis of large intestine without perforation or abscess without bleeding: Secondary | ICD-10-CM | POA: Diagnosis not present

## 2018-10-30 DIAGNOSIS — Z8673 Personal history of transient ischemic attack (TIA), and cerebral infarction without residual deficits: Secondary | ICD-10-CM | POA: Diagnosis not present

## 2018-10-30 DIAGNOSIS — R1030 Lower abdominal pain, unspecified: Secondary | ICD-10-CM | POA: Diagnosis present

## 2018-10-30 DIAGNOSIS — K5792 Diverticulitis of intestine, part unspecified, without perforation or abscess without bleeding: Secondary | ICD-10-CM

## 2018-10-30 LAB — CBC
HEMATOCRIT: 42.8 % (ref 39.0–52.0)
HEMOGLOBIN: 14.5 g/dL (ref 13.0–17.0)
MCH: 31.6 pg (ref 26.0–34.0)
MCHC: 33.9 g/dL (ref 30.0–36.0)
MCV: 93.2 fL (ref 80.0–100.0)
Platelets: 168 10*3/uL (ref 150–400)
RBC: 4.59 MIL/uL (ref 4.22–5.81)
RDW: 12.1 % (ref 11.5–15.5)
WBC: 9.5 10*3/uL (ref 4.0–10.5)
nRBC: 0 % (ref 0.0–0.2)

## 2018-10-30 LAB — COMPREHENSIVE METABOLIC PANEL
ALT: 39 U/L (ref 0–44)
AST: 35 U/L (ref 15–41)
Albumin: 3.9 g/dL (ref 3.5–5.0)
Alkaline Phosphatase: 52 U/L (ref 38–126)
Anion gap: 10 (ref 5–15)
BILIRUBIN TOTAL: 1.2 mg/dL (ref 0.3–1.2)
BUN: 15 mg/dL (ref 6–20)
CHLORIDE: 105 mmol/L (ref 98–111)
CO2: 23 mmol/L (ref 22–32)
Calcium: 9.1 mg/dL (ref 8.9–10.3)
Creatinine, Ser: 1.21 mg/dL (ref 0.61–1.24)
GFR calc Af Amer: >60 mL/min (ref >60)
GLUCOSE: 102 mg/dL — AB (ref 70–99)
POTASSIUM: 3.9 mmol/L (ref 3.5–5.1)
Sodium: 138 mmol/L (ref 135–145)
TOTAL PROTEIN: 6.6 g/dL (ref 6.5–8.1)

## 2018-10-30 LAB — URINALYSIS, ROUTINE W REFLEX MICROSCOPIC
BILIRUBIN URINE: NEGATIVE
Glucose, UA: NEGATIVE mg/dL
HGB URINE DIPSTICK: NEGATIVE
KETONES UR: NEGATIVE mg/dL
Leukocytes, UA: NEGATIVE
Nitrite: NEGATIVE
Protein, ur: NEGATIVE mg/dL
SPECIFIC GRAVITY, URINE: 1.012 (ref 1.005–1.030)
pH: 6 (ref 5.0–8.0)

## 2018-10-30 LAB — LIPASE, BLOOD: Lipase: 39 U/L (ref 11–51)

## 2018-10-30 MED ORDER — NAPROXEN 500 MG PO TABS: 500.0000 mg | ORAL_TABLET | Freq: Two times a day (BID) | ORAL | 0 refills | Status: DC

## 2018-10-30 MED ORDER — KETOROLAC TROMETHAMINE 15 MG/ML IJ SOLN
15.0000 mg | Freq: Once | INTRAMUSCULAR | Status: AC
Start: 2018-10-30 — End: 2018-10-30
  Administered 2018-10-30: 15 mg via INTRAVENOUS
  Filled 2018-10-30: qty 1

## 2018-10-30 MED ORDER — AMOXICILLIN-POT CLAVULANATE 875-125 MG PO TABS
1.0000 | ORAL_TABLET | Freq: Once | ORAL | Status: AC
  Administered 2018-10-30: 1 via ORAL
  Filled 2018-10-30: qty 1

## 2018-10-30 MED ORDER — AMOXICILLIN-POT CLAVULANATE 875-125 MG PO TABS
1.0000 | ORAL_TABLET | Freq: Two times a day (BID) | ORAL | 0 refills | Status: AC
Start: 2018-10-30 — End: 2018-11-06

## 2018-10-30 NOTE — ED Notes (Signed)
ED Provider at bedside. 

## 2018-10-30 NOTE — ED Provider Notes (Signed)
[facility] Emergency Department Provider Note MRN:  [id number]  Arrival date & time: 10/30/18     Chief Complaint   Abdominal Pain   History of Present Illness   [name] is a 52 y.o. year-old male with a history of diverticulitis status post partial colectomy presenting to the ED with chief complaint of abdominal pain.  5 days of progressively worsening lower abdominal pain.  Described as dull, moderate in severity.  Worse with palpation.  Denies fever, no chest pain or shortness of breath, noted some constipation last week.  Review of Systems  A complete 10 system review of systems was obtained and all systems are negative except as noted in the HPI and PMH.   Patient's Health History    Past Medical History:  Diagnosis Date  . Anxiety   . Chest pain   . Diverticulitis   . GERD (gastroesophageal reflux disease)   . History of cold urticaria   . PFO (patent foramen ovale)   . PFO (patent foramen ovale)   . Sleep apnea    mild sleep apnea   . TIA (transient ischemic attack)     Past Surgical History:  Procedure Laterality Date  . COLON SURGERY    . INGUINAL HERNIA REPAIR Right 02/26/2015   Procedure: OPEN RIGHT INGUINAL HERNIA REPAIR ;  Surgeon: Johnathan Hausen, MD;  Location: [facility];  Service: General;  Laterality: Right;  With MESH  . MOUTH SURGERY    . SCAR REVISION N/A 02/26/2015   Procedure: MIDLINE SCAR REVISION;  Surgeon: Johnathan Hausen, MD;  Location: [facility];  Service: General;  Laterality: N/A;    History reviewed. No pertinent family history.  Social History   Socioeconomic History  . Marital status: Divorced    Spouse name: Not on file  . Number of children: Not on file  . Years of education: Not on file  . Highest education level: Not on file  Occupational History  . Not on file  Social Needs  . Financial resource strain: Not on file  . Food insecurity:    Worry: Not on file    Inability: Not on file  . Transportation  needs:    Medical: Not on file    Non-medical: Not on file  Tobacco Use  . Smoking status: Never Smoker  . Smokeless tobacco: Never Used  Substance and Sexual Activity  . Alcohol use: Yes    Comment: 1-2 drinks daily  . Drug use: No  . Sexual activity: Not on file  Lifestyle  . Physical activity:    Days per week: Not on file    Minutes per session: Not on file  . Stress: Not on file  Relationships  . Social connections:    Talks on phone: Not on file    Gets together: Not on file    Attends religious service: Not on file    Active member of club or organization: Not on file    Attends meetings of clubs or organizations: Not on file    Relationship status: Not on file  . Intimate partner violence:    Fear of current or ex partner: Not on file    Emotionally abused: Not on file    Physically abused: Not on file    Forced sexual activity: Not on file  Other Topics Concern  . Not on file  Social History Narrative  . Not on file     Physical Exam  Vital Signs and Nursing Notes reviewed  Vitals:   10/30/18 2230 10/30/18 2245  BP: 129/73 135/88  Pulse: (!) 56 (!) 51  Resp:  16  Temp:    SpO2: 99% 100%    CONSTITUTIONAL: Well-appearing, NAD NEURO:  Alert and oriented x 3, no focal deficits EYES:  eyes equal and reactive ENT/NECK:  no LAD, no JVD CARDIO: Regular rate, well-perfused, normal S1 and S2 PULM:  CTAB no wheezing or rhonchi GI/GU:  normal bowel sounds, non-distended, mild tenderness to palpation to the lower abdominal quadrants, no rebound, no guarding, palpation of the right lower quadrant elicits pain in the left lower quadrant. MSK/SPINE:  No gross deformities, no edema SKIN:  no rash, atraumatic PSYCH:  Appropriate speech and behavior  Diagnostic and Interventional Summary    Labs Reviewed  COMPREHENSIVE METABOLIC PANEL - Abnormal; Notable for the following components:      Result Value   Glucose, Bld 102 (*)    All other components within normal  limits  LIPASE, BLOOD  CBC  URINALYSIS, ROUTINE W REFLEX MICROSCOPIC    CT ABDOMEN PELVIS WO CONTRAST  Final Result      Medications  ketorolac (TORADOL) 15 MG/ML injection 15 mg (15 mg Intravenous Given 10/30/18 2111)  amoxicillin-clavulanate (AUGMENTIN) 875-125 MG per tablet 1 tablet (1 tablet Oral Given 10/30/18 2250)     Procedures Critical Care  ED Course and Medical Decision Making  I have reviewed the triage vital signs and the nursing notes.  Pertinent labs & imaging results that were available during my care of the patient were reviewed by me and considered in my medical decision making (see below for details).  Will evaluate for recurrence of diverticulitis in this 52 year old male with history of the same.  Vital signs stable, very nontoxic appearing, labs pending, CT pending without contrast given patient's anaphylactic allergy.  CT reveals uncomplicated diverticulitis.  Prescription for Augmentin, will follow-up with PCP.  After the discussed management above, the patient was determined to be safe for discharge.  The patient was in agreement with this plan and all questions regarding their care were answered.  ED return precautions were discussed and the patient will return to the ED with any significant worsening of condition.  Barth Kirks. [place] Small, [place] [email or web address]  Final Clinical Impressions(s) / ED Diagnoses     ICD-10-CM   1. Diverticulitis K57.92   2. Lower abdominal pain R10.30     ED Discharge Orders         Ordered    amoxicillin-clavulanate (AUGMENTIN) 875-125 MG tablet  Every 12 hours     10/30/18 2305    naproxen (NAPROSYN) 500 MG tablet  2 times daily     10/30/18 2305             Maudie Flakes, MD 10/30/18 2307

## 2018-10-30 NOTE — Discharge Instructions (Addendum)
You were evaluated in the Emergency Department and after careful evaluation, we did not find any emergent condition requiring admission or further testing in the hospital.  Your symptoms today seem to be due to diverticulitis.  Your CT did not reveal any complicating features, such as abscess or perforation.  This case of diverticulitis should be fully treated with the antibiotics provided.  You can use the Naprosyn anti-inflammatory medication as needed for pain.  Please return to the Emergency Department if you experience any worsening of your condition.  We encourage you to follow up with a primary care provider.  Thank you for allowing [place] to be a part of your care.

## 2018-10-30 NOTE — ED Triage Notes (Signed)
Per pt he is having lower abdominal pain. Stated it has moved some to the left back. Pt stated he has hx of diverticulitis and had part of colon removed and feels the same. No N/V, no fevers

## 2018-10-30 NOTE — ED Notes (Signed)
Patient transported to CT 

## 2018-10-30 NOTE — ED Notes (Signed)
Pt verbalizes understanding of d/c instructions. Prescriptions reviewed with patient. Pt ambulatory at d/c with all belongings and with family.   

## 2021-04-19 ENCOUNTER — Other Ambulatory Visit: Payer: Self-pay | Admitting: Family Medicine

## 2021-04-19 DIAGNOSIS — N5089 Other specified disorders of the male genital organs: Secondary | ICD-10-CM

## 2021-04-25 ENCOUNTER — Ambulatory Visit (HOSPITAL_COMMUNITY)
Admission: EM | Admit: 2021-04-25 | Discharge: 2021-04-25 | Disposition: A | Discharge status: Home / Self Care | Payer: No Typology Code available for payment source | Attending: Internal Medicine | Admitting: Internal Medicine

## 2021-04-25 ENCOUNTER — Other Ambulatory Visit: Payer: Self-pay | Admitting: Cardiology

## 2021-04-25 ENCOUNTER — Encounter (HOSPITAL_COMMUNITY): Payer: Self-pay

## 2021-04-25 ENCOUNTER — Other Ambulatory Visit: Payer: Self-pay

## 2021-04-25 DIAGNOSIS — R002 Palpitations: Secondary | ICD-10-CM

## 2021-04-25 NOTE — Discharge Instructions (Addendum)
Cardiology will call you tomorrow to schedule Holter or event monitoring If you do not receive a call tomorrow please call the number on the chart to schedule the appointment for the Holter monitoring. If you have worsening symptoms please go to the emergency department to be evaluated further.

## 2021-04-25 NOTE — ED Provider Notes (Signed)
[place]    CSN: [id number] Arrival date & time: 04/25/21  1700      History   Chief Complaint Chief Complaint  Patient presents with   Fluttering in Chest    HPI [name] is a 55 y.o. male with a history of TIA and ASD on aspirin comes to the urgent care with complaints of "fluttering heart" this morning.  Patient says the sensation will come out of bed.  He denies any chest pain or chest pressure.  He did have with the dizziness in the morning and was able to go to work.  Sometime this afternoon he started experiencing palpitations again and this time around it was associated with some fatigue and lightheadedness.  The fluttering subsided for about 2 hours over the course of the day.  He decided to come to the urgent care to be evaluated.  No nausea or vomiting.  No upper respiratory infection symptoms.  Patient has a history of hypertension controlled with amlodipine.   HPI  Past Medical History:  Diagnosis Date   Anxiety    Chest pain    Diverticulitis    GERD (gastroesophageal reflux disease)    History of cold urticaria    PFO (patent foramen ovale)    PFO (patent foramen ovale)    Sleep apnea    mild sleep apnea    TIA (transient ischemic attack)     Patient Active Problem List   Diagnosis Date Noted   Chest pain 04/07/2014    Past Surgical History:  Procedure Laterality Date   COLON SURGERY     INGUINAL HERNIA REPAIR Right 02/26/2015   Procedure: OPEN RIGHT INGUINAL HERNIA REPAIR ;  Surgeon: Johnathan Hausen, MD;  Location: [facility];  Service: General;  Laterality: Right;  With [place] N/A 02/26/2015   Procedure: MIDLINE SCAR REVISION;  Surgeon: Johnathan Hausen, MD;  Location: [facility];  Service: General;  Laterality: N/A;       Home Medications    Prior to Admission medications   Medication Sig Start Date End Date Taking? Authorizing Provider  acetaminophen (TYLENOL) 500 MG tablet Take 500 mg by mouth  every 6 (six) hours as needed for headache (pain).     [provider]  amLODipine (NORVASC) 2.5 MG tablet Take 2.5 mg by mouth daily at 12 noon.    [provider]  aspirin EC 81 MG tablet Take 81 mg by mouth daily at 12 noon.     [provider]  famotidine-calcium carbonate-magnesium hydroxide (PEPCID COMPLETE) 10-800-165 MG chewable tablet Chew 1 tablet by mouth daily as needed (acid reflux/heartburn).    [provider]  ibuprofen (ADVIL,MOTRIN) 200 MG tablet Take 200 mg by mouth every 6 (six) hours as needed (pain).     [provider]  Methylcellulose, Laxative, (CITRUCEL PO) Take 4 tablets by mouth daily at 12 noon.    [provider]  Multiple Vitamin (MULTIVITAMIN WITH MINERALS) TABS tablet Take 1 tablet by mouth daily at 12 noon.     [provider]  naproxen (NAPROSYN) 500 MG tablet Take 1 tablet (500 mg total) by mouth 2 (two) times daily. 10/30/18   Maudie Flakes, MD  Probiotic Product (PROBIOTIC PO) Take 1 tablet by mouth daily at 12 noon.    [provider]    Family History History reviewed. No pertinent family history.  Social History Social History   Tobacco Use  Smoking status: Never   Smokeless tobacco: Never  Substance Use Topics   Alcohol use: Yes    Comment: 1-2 drinks daily   Drug use: No     Allergies   Contrast media [iodinated diagnostic agents], Other, Sulfa antibiotics, and Sulfa drugs cross reactors   Review of Systems Review of Systems  Constitutional:  Positive for fatigue.  Cardiovascular:  Positive for palpitations. Negative for chest pain.  Gastrointestinal: Negative.   Neurological:  Positive for dizziness and light-headedness. Negative for headaches.    Physical Exam Triage Vital Signs ED Triage Vitals  Enc Vitals Group     BP 04/25/21 1727 128/72     Pulse Rate 04/25/21 1727 (!) 55     Resp 04/25/21 1727 18     Temp 04/25/21 1727 98.5 F (36.9 C)     Temp  Source 04/25/21 1727 Oral     SpO2 04/25/21 1727 100 %     Weight --      Height --      Head Circumference --      Peak Flow --      Pain Score 04/25/21 1726 0     Pain Loc --      Pain Edu? --      Excl. in [place]? --    No data found.  Updated Vital Signs BP 128/72 (BP Location: Right Arm)   Pulse (!) 55   Temp 98.5 F (36.9 C) (Oral)   Resp 18   SpO2 100%   Visual Acuity Right Eye Distance:   Left Eye Distance:   Bilateral Distance:    Right Eye Near:   Left Eye Near:    Bilateral Near:     Physical Exam Vitals and nursing note reviewed.  Constitutional:      General: He is not in acute distress.    Appearance: He is not ill-appearing.  Cardiovascular:     Rate and Rhythm: Normal rate and regular rhythm.     Pulses: Normal pulses.     Heart sounds: Normal heart sounds.  Pulmonary:     Effort: Pulmonary effort is normal.     Breath sounds: Normal breath sounds.  Abdominal:     General: Bowel sounds are normal.     Palpations: Abdomen is soft.  Neurological:     Mental Status: He is alert.     UC Treatments / Results  Labs (all labs ordered are listed, but only abnormal results are displayed) Labs Reviewed - No data to display  EKG   Radiology No results found.  Procedures Procedures (including critical care time)  Medications Ordered in UC Medications - No data to display  Initial Impression / Assessment and Plan / UC Course  I have reviewed the triage vital signs and the nursing notes.  Pertinent labs & imaging results that were available during my care of the patient were reviewed by me and considered in my medical decision making (see chart for details).     1.  Palpitations: I spoke with the cardiologist on-call Dr. Radford Pax who recommended Holter or event monitoring.  Her practice will call the patient tomorrow to schedule the Holter monitoring. If symptoms worsen patient is advised to come to go to ED to be reevaluated Final Clinical  Impressions(s) / UC Diagnoses   Final diagnoses:  Palpitations     Discharge Instructions      Cardiology will call you tomorrow to schedule Holter or event monitoring If you do not receive a  call tomorrow please call the number on the chart to schedule the appointment for the Holter monitoring. If you have worsening symptoms please go to the emergency department to be evaluated further.   ED Prescriptions   None    PDMP not reviewed this encounter.   Chase Picket, MD 04/25/21 [phone]

## 2021-04-25 NOTE — ED Triage Notes (Signed)
Pt presents with generalized intermittent fluttering in chest since waking up this morning.

## 2021-05-02 ENCOUNTER — Ambulatory Visit
Admission: RE | Admit: 2021-05-02 | Discharge: 2021-05-02 | Disposition: A | Discharge status: Home / Self Care | Payer: BLUE CROSS/BLUE SHIELD | Source: Ambulatory Visit | Attending: Family Medicine | Admitting: Family Medicine

## 2021-05-02 DIAGNOSIS — N5089 Other specified disorders of the male genital organs: Secondary | ICD-10-CM

## 2021-05-15 ENCOUNTER — Ambulatory Visit (INDEPENDENT_AMBULATORY_CARE_PROVIDER_SITE_OTHER): Payer: No Typology Code available for payment source

## 2021-05-15 DIAGNOSIS — R002 Palpitations: Secondary | ICD-10-CM

## 2021-05-15 DIAGNOSIS — R42 Dizziness and giddiness: Secondary | ICD-10-CM | POA: Diagnosis not present

## 2021-05-24 ENCOUNTER — Ambulatory Visit: Payer: BLUE CROSS/BLUE SHIELD | Admitting: Cardiology

## 2021-06-15 ENCOUNTER — Other Ambulatory Visit: Payer: Self-pay | Admitting: Cardiology

## 2021-06-15 DIAGNOSIS — R002 Palpitations: Secondary | ICD-10-CM

## 2021-06-15 DIAGNOSIS — R42 Dizziness and giddiness: Secondary | ICD-10-CM

## 2021-07-11 ENCOUNTER — Other Ambulatory Visit: Payer: Self-pay

## 2021-07-11 ENCOUNTER — Encounter (HOSPITAL_COMMUNITY): Payer: Self-pay

## 2021-07-11 ENCOUNTER — Ambulatory Visit (HOSPITAL_COMMUNITY)
Admission: EM | Admit: 2021-07-11 | Discharge: 2021-07-11 | Disposition: A | Discharge status: Home / Self Care | Payer: No Typology Code available for payment source | Attending: Family Medicine | Admitting: Family Medicine

## 2021-07-11 DIAGNOSIS — R1032 Left lower quadrant pain: Secondary | ICD-10-CM | POA: Insufficient documentation

## 2021-07-11 LAB — CBC WITH DIFFERENTIAL/PLATELET
Abs Immature Granulocytes: 0.05 10*3/uL (ref 0.00–0.07)
Basophils Absolute: 0.1 10*3/uL (ref 0.0–0.1)
Basophils Relative: 1 %
Eosinophils Absolute: 0.1 10*3/uL (ref 0.0–0.5)
Eosinophils Relative: 2 %
HCT: 44 % (ref 39.0–52.0)
Hemoglobin: 15.7 g/dL (ref 13.0–17.0)
Immature Granulocytes: 1 %
Lymphocytes Relative: 24 %
Lymphs Abs: 2.3 10*3/uL (ref 0.7–4.0)
MCH: 32.4 pg (ref 26.0–34.0)
MCHC: 35.7 g/dL (ref 30.0–36.0)
MCV: 90.7 fL (ref 80.0–100.0)
Monocytes Absolute: 1 10*3/uL (ref 0.1–1.0)
Monocytes Relative: 11 %
Neutro Abs: 5.9 10*3/uL (ref 1.7–7.7)
Neutrophils Relative %: 61 %
Platelets: 167 10*3/uL (ref 150–400)
RBC: 4.85 MIL/uL (ref 4.22–5.81)
RDW: 12 % (ref 11.5–15.5)
WBC: 9.4 10*3/uL (ref 4.0–10.5)
nRBC: 0 % (ref 0.0–0.2)

## 2021-07-11 LAB — COMPREHENSIVE METABOLIC PANEL
ALT: 22 U/L (ref 0–44)
AST: 24 U/L (ref 15–41)
Albumin: 4 g/dL (ref 3.5–5.0)
Alkaline Phosphatase: 65 U/L (ref 38–126)
Anion gap: 7 (ref 5–15)
BUN: 15 mg/dL (ref 6–20)
CO2: 24 mmol/L (ref 22–32)
Calcium: 9.1 mg/dL (ref 8.9–10.3)
Chloride: 104 mmol/L (ref 98–111)
Creatinine, Ser: 1.19 mg/dL (ref 0.61–1.24)
GFR, Estimated: >60 mL/min (ref >60)
Glucose, Bld: 100 mg/dL — ABNORMAL HIGH (ref 70–99)
Potassium: 3.6 mmol/L (ref 3.5–5.1)
Sodium: 135 mmol/L (ref 135–145)
Total Bilirubin: 1.7 mg/dL — ABNORMAL HIGH (ref 0.3–1.2)
Total Protein: 7 g/dL (ref 6.5–8.1)

## 2021-07-11 MED ORDER — METRONIDAZOLE 500 MG PO TABS: 500.0000 mg | ORAL_TABLET | Freq: Three times a day (TID) | ORAL | 0 refills | Status: DC

## 2021-07-11 MED ORDER — CIPROFLOXACIN HCL 500 MG PO TABS: 500.0000 mg | ORAL_TABLET | Freq: Two times a day (BID) | ORAL | 0 refills | Status: DC

## 2021-07-11 NOTE — ED Triage Notes (Signed)
Pt reports left lower quadrant abdominal pain x 2 days. Pt reports having history of diverticulitis. Denies nausea, vomiting, diarrhea.

## 2021-07-13 NOTE — ED Provider Notes (Signed)
[place]    CSN: [id number] Arrival date & time: 07/11/21  1903      History   Chief Complaint No chief complaint on file.   HPI [name] is a 55 y.o. male.   Patient presenting today with 2-day history of worsening left lower quadrant pain.  He currently denies nausea, vomiting, diarrhea, dysuria, hematuria, flank pain, chest pain, shortness of breath, upper respiratory symptoms.  He denies any new medications, diet changes, recent travel.  He has a history of diverticulitis and is status post partial colectomy but states he still gets flares every now and again that started very similarly to this.  Does also have a history of GERD.  Not currently taking anything over-the-counter for symptoms.     Past Medical History:  Diagnosis Date   Anxiety    Chest pain    Diverticulitis    GERD (gastroesophageal reflux disease)    History of cold urticaria    PFO (patent foramen ovale)    PFO (patent foramen ovale)    Sleep apnea    mild sleep apnea    TIA (transient ischemic attack)     Patient Active Problem List   Diagnosis Date Noted   Chest pain 04/07/2014    Past Surgical History:  Procedure Laterality Date   COLON SURGERY     INGUINAL HERNIA REPAIR Right 02/26/2015   Procedure: OPEN RIGHT INGUINAL HERNIA REPAIR ;  Surgeon: Johnathan Hausen, MD;  Location: [facility];  Service: General;  Laterality: Right;  With [place] N/A 02/26/2015   Procedure: MIDLINE SCAR REVISION;  Surgeon: Johnathan Hausen, MD;  Location: [facility];  Service: General;  Laterality: N/A;     Home Medications    Prior to Admission medications   Medication Sig Start Date End Date Taking? Authorizing Provider  ciprofloxacin (CIPRO) 500 MG tablet Take 1 tablet (500 mg total) by mouth 2 (two) times daily. 07/11/21  Yes Volney American, PA-C  metroNIDAZOLE (FLAGYL) 500 MG tablet Take 1 tablet (500 mg total) by mouth 3 (three) times daily. 07/11/21  Yes  Volney American, PA-C  acetaminophen (TYLENOL) 500 MG tablet Take 500 mg by mouth every 6 (six) hours as needed for headache (pain).     [provider]  amLODipine (NORVASC) 2.5 MG tablet Take 2.5 mg by mouth daily at 12 noon.    [provider]  aspirin EC 81 MG tablet Take 81 mg by mouth daily at 12 noon.     [provider]  famotidine-calcium carbonate-magnesium hydroxide (PEPCID COMPLETE) 10-800-165 MG chewable tablet Chew 1 tablet by mouth daily as needed (acid reflux/heartburn).    [provider]  ibuprofen (ADVIL,MOTRIN) 200 MG tablet Take 200 mg by mouth every 6 (six) hours as needed (pain).     [provider]  Methylcellulose, Laxative, (CITRUCEL PO) Take 4 tablets by mouth daily at 12 noon.    [provider]  Multiple Vitamin (MULTIVITAMIN WITH MINERALS) TABS tablet Take 1 tablet by mouth daily at 12 noon.     [provider]  naproxen (NAPROSYN) 500 MG tablet Take 1 tablet (500 mg total) by mouth 2 (two) times daily. 10/30/18   Maudie Flakes, MD  Probiotic Product (PROBIOTIC PO) Take 1 tablet by mouth daily at 12 noon.    [provider]   Family History History reviewed. No pertinent family history.  Social History Social History   Tobacco  Use   Smoking status: Never   Smokeless tobacco: Never  Substance Use Topics   Alcohol use: Yes    Comment: 1-2 drinks daily   Drug use: No     Allergies   Contrast media [iodinated diagnostic agents], Other, Sulfa antibiotics, and Sulfa drugs cross reactors   Review of Systems Review of Systems Per HPI  Physical Exam Triage Vital Signs ED Triage Vitals  Enc Vitals Group     BP 07/11/21 1942 134/85     Pulse Rate 07/11/21 1942 68     Resp 07/11/21 1942 18     Temp 07/11/21 1942 99.3 F (37.4 C)     Temp Source 07/11/21 1942 Oral     SpO2 07/11/21 1942 99 %     Weight --      Height --      Head Circumference --      Peak Flow --       Pain Score 07/11/21 1937 5     Pain Loc --      Pain Edu? --      Excl. in [place]? --    No data found.  Updated Vital Signs BP 134/85 (BP Location: Right Arm)   Pulse 68   Temp 99.3 F (37.4 C) (Oral)   Resp 18   SpO2 99%   Visual Acuity Right Eye Distance:   Left Eye Distance:   Bilateral Distance:    Right Eye Near:   Left Eye Near:    Bilateral Near:     Physical Exam Vitals and nursing note reviewed.  Constitutional:      Appearance: Normal appearance.  HENT:     Head: Atraumatic.  Eyes:     Extraocular Movements: Extraocular movements intact.     Conjunctiva/sclera: Conjunctivae normal.  Cardiovascular:     Rate and Rhythm: Normal rate and regular rhythm.  Pulmonary:     Effort: Pulmonary effort is normal.     Breath sounds: Normal breath sounds.  Abdominal:     General: There is no distension.     Palpations: Abdomen is soft.     Tenderness: There is abdominal tenderness. There is no right CVA tenderness, left CVA tenderness or guarding.     Comments: Bowel sounds mildly hyperactive lower quadrants Moderate localized tenderness palpation left lower quadrant without rebound or distention  Musculoskeletal:        General: Normal range of motion.     Cervical back: Normal range of motion and neck supple.  Skin:    General: Skin is warm and dry.  Neurological:     General: No focal deficit present.     Mental Status: He is oriented to person, place, and time.  Psychiatric:        Mood and Affect: Mood normal.        Thought Content: Thought content normal.        Judgment: Judgment normal.   UC Treatments / Results  Labs (all labs ordered are listed, but only abnormal results are displayed) Labs Reviewed  COMPREHENSIVE METABOLIC PANEL - Abnormal; Notable for the following components:      Result Value   Glucose, Bld 100 (*)    Total Bilirubin 1.7 (*)    All other components within normal limits  CBC WITH DIFFERENTIAL/PLATELET   EKG  Radiology No  results found.  Procedures Procedures (including critical care time)  Medications Ordered in UC Medications - No data to display  Initial Impression / Assessment and  Plan / UC Course  I have reviewed the triage vital signs and the nursing notes.  Pertinent labs & imaging results that were available during my care of the patient were reviewed by me and considered in my medical decision making (see chart for details).     CBC, CMP pending, given symptoms and significant history of diverticulitis will cover with Cipro and Flagyl for likely developing diverticulitis infection.  Discussed going to the emergency department if worsening.  Supportive care reviewed.  Final Clinical Impressions(s) / UC Diagnoses   Final diagnoses:  LLQ pain   Discharge Instructions   None    ED Prescriptions     Medication Sig Dispense Auth. Provider   ciprofloxacin (CIPRO) 500 MG tablet Take 1 tablet (500 mg total) by mouth 2 (two) times daily. 14 tablet Volney American, [place]   metroNIDAZOLE (FLAGYL) 500 MG tablet Take 1 tablet (500 mg total) by mouth 3 (three) times daily. 21 tablet Volney American, [place]      PDMP not reviewed this encounter.   Merrie Roof [place], [place] 07/15/21 [phone]

## 2021-09-09 ENCOUNTER — Ambulatory Visit (HOSPITAL_COMMUNITY)
Admission: EM | Admit: 2021-09-09 | Discharge: 2021-09-09 | Disposition: A | Discharge status: Home / Self Care | Payer: No Typology Code available for payment source | Source: Home / Self Care

## 2021-09-09 ENCOUNTER — Encounter (HOSPITAL_COMMUNITY): Payer: Self-pay

## 2021-09-09 ENCOUNTER — Observation Stay (HOSPITAL_COMMUNITY)
Admission: EM | Admit: 2021-09-09 | Discharge: 2021-09-10 | Disposition: A | Discharge status: Home / Self Care | Payer: No Typology Code available for payment source | Attending: Internal Medicine | Admitting: Internal Medicine

## 2021-09-09 ENCOUNTER — Emergency Department (HOSPITAL_COMMUNITY): Payer: No Typology Code available for payment source

## 2021-09-09 ENCOUNTER — Other Ambulatory Visit: Payer: Self-pay

## 2021-09-09 DIAGNOSIS — Z7982 Long term (current) use of aspirin: Secondary | ICD-10-CM | POA: Insufficient documentation

## 2021-09-09 DIAGNOSIS — Z9989 Dependence on other enabling machines and devices: Secondary | ICD-10-CM

## 2021-09-09 DIAGNOSIS — G4733 Obstructive sleep apnea (adult) (pediatric): Secondary | ICD-10-CM

## 2021-09-09 DIAGNOSIS — I10 Essential (primary) hypertension: Secondary | ICD-10-CM

## 2021-09-09 DIAGNOSIS — Z8719 Personal history of other diseases of the digestive system: Secondary | ICD-10-CM

## 2021-09-09 DIAGNOSIS — R1032 Left lower quadrant pain: Secondary | ICD-10-CM

## 2021-09-09 DIAGNOSIS — Z8673 Personal history of transient ischemic attack (TIA), and cerebral infarction without residual deficits: Secondary | ICD-10-CM | POA: Diagnosis not present

## 2021-09-09 DIAGNOSIS — K5792 Diverticulitis of intestine, part unspecified, without perforation or abscess without bleeding: Secondary | ICD-10-CM | POA: Diagnosis not present

## 2021-09-09 DIAGNOSIS — Z20822 Contact with and (suspected) exposure to covid-19: Secondary | ICD-10-CM | POA: Insufficient documentation

## 2021-09-09 DIAGNOSIS — Z79899 Other long term (current) drug therapy: Secondary | ICD-10-CM | POA: Diagnosis not present

## 2021-09-09 LAB — CBC WITH DIFFERENTIAL/PLATELET
Abs Immature Granulocytes: 0.04 10*3/uL (ref 0.00–0.07)
Basophils Absolute: 0.1 10*3/uL (ref 0.0–0.1)
Basophils Relative: 1 %
Eosinophils Absolute: 0.1 10*3/uL (ref 0.0–0.5)
Eosinophils Relative: 1 %
HCT: 45.2 % (ref 39.0–52.0)
Hemoglobin: 15.8 g/dL (ref 13.0–17.0)
Immature Granulocytes: 0 %
Lymphocytes Relative: 17 %
Lymphs Abs: 1.9 10*3/uL (ref 0.7–4.0)
MCH: 32.3 pg (ref 26.0–34.0)
MCHC: 35 g/dL (ref 30.0–36.0)
MCV: 92.4 fL (ref 80.0–100.0)
Monocytes Absolute: 1 10*3/uL (ref 0.1–1.0)
Monocytes Relative: 9 %
Neutro Abs: 8 10*3/uL — ABNORMAL HIGH (ref 1.7–7.7)
Neutrophils Relative %: 72 %
Platelets: 194 10*3/uL (ref 150–400)
RBC: 4.89 MIL/uL (ref 4.22–5.81)
RDW: 12.1 % (ref 11.5–15.5)
WBC: 11.1 10*3/uL — ABNORMAL HIGH (ref 4.0–10.5)
nRBC: 0 % (ref 0.0–0.2)

## 2021-09-09 LAB — COMPREHENSIVE METABOLIC PANEL
ALT: 24 U/L (ref 0–44)
AST: 24 U/L (ref 15–41)
Albumin: 4.3 g/dL (ref 3.5–5.0)
Alkaline Phosphatase: 67 U/L (ref 38–126)
Anion gap: 6 (ref 5–15)
BUN: 10 mg/dL (ref 6–20)
CO2: 27 mmol/L (ref 22–32)
Calcium: 9.1 mg/dL (ref 8.9–10.3)
Chloride: 104 mmol/L (ref 98–111)
Creatinine, Ser: 1.18 mg/dL (ref 0.61–1.24)
GFR, Estimated: >60 mL/min (ref >60)
Glucose, Bld: 84 mg/dL (ref 70–99)
Potassium: 3.9 mmol/L (ref 3.5–5.1)
Sodium: 137 mmol/L (ref 135–145)
Total Bilirubin: 2.6 mg/dL — ABNORMAL HIGH (ref 0.3–1.2)
Total Protein: 7.1 g/dL (ref 6.5–8.1)

## 2021-09-09 LAB — POCT URINALYSIS DIPSTICK, ED / UC
Bilirubin Urine: NEGATIVE
Glucose, UA: NEGATIVE mg/dL
Hgb urine dipstick: NEGATIVE
Ketones, ur: NEGATIVE mg/dL
Leukocytes,Ua: NEGATIVE
Nitrite: NEGATIVE
Protein, ur: NEGATIVE mg/dL
Specific Gravity, Urine: 1.02 (ref 1.005–1.030)
Urobilinogen, UA: 0.2 mg/dL (ref 0.0–1.0)
pH: 6.5 (ref 5.0–8.0)

## 2021-09-09 LAB — RESP PANEL BY RT-PCR (FLU A&B, COVID) ARPGX2
Influenza A by PCR: NEGATIVE
Influenza B by PCR: NEGATIVE
SARS Coronavirus 2 by RT PCR: NEGATIVE

## 2021-09-09 MED ORDER — HYDROMORPHONE HCL 1 MG/ML IJ SOLN
1.0000 mg | Freq: Once | INTRAMUSCULAR | Status: AC
  Administered 2021-09-09: 1 mg via INTRAVENOUS
  Filled 2021-09-09: qty 1

## 2021-09-09 MED ORDER — SODIUM CHLORIDE 0.9 % IV SOLN: INTRAVENOUS | Status: AC

## 2021-09-09 MED ORDER — SODIUM CHLORIDE 0.9 % IV SOLN
3.0000 g | Freq: Four times a day (QID) | INTRAVENOUS | Status: DC
  Administered 2021-09-10 (×3): 3 g via INTRAVENOUS
  Filled 2021-09-09 (×5): qty 8

## 2021-09-09 MED ORDER — HYDROMORPHONE HCL 1 MG/ML IJ SOLN: 1.0000 mg | INTRAMUSCULAR | Status: DC | PRN

## 2021-09-09 MED ORDER — MORPHINE SULFATE (PF) 2 MG/ML IV SOLN: 1.0000 mg | INTRAVENOUS | Status: DC | PRN

## 2021-09-09 MED ORDER — SODIUM CHLORIDE 0.9 % IV SOLN
3.0000 g | Freq: Once | INTRAVENOUS | Status: AC
  Administered 2021-09-09: 3 g via INTRAVENOUS
  Filled 2021-09-09: qty 8

## 2021-09-09 MED ORDER — AMOXICILLIN-POT CLAVULANATE 875-125 MG PO TABS: 1.0000 | ORAL_TABLET | Freq: Two times a day (BID) | ORAL | 0 refills | Status: AC

## 2021-09-09 MED ORDER — ONDANSETRON HCL 4 MG/2ML IJ SOLN
4.0000 mg | Freq: Once | INTRAMUSCULAR | Status: AC
  Administered 2021-09-09: 4 mg via INTRAVENOUS
  Filled 2021-09-09: qty 2

## 2021-09-09 MED ORDER — ENOXAPARIN SODIUM 40 MG/0.4ML IJ SOSY
40.0000 mg | PREFILLED_SYRINGE | INTRAMUSCULAR | Status: DC
  Administered 2021-09-10: 40 mg via SUBCUTANEOUS
  Filled 2021-09-09: qty 0.4

## 2021-09-09 NOTE — ED Provider Notes (Signed)
[name]    CSN: [id number] Arrival date & time: 09/09/21  1327      History   Chief Complaint Chief Complaint  Patient presents with   Abdominal Pain    HPI [name] is a 55 y.o. male.   Patient presents today with 1 day history of worsening left lower quadrant abdominal pain.  He has a history of diverticulitis and is status post partial colectomy approximately 14 years ago.  He was flare free for over 10 years but recently has had recurrent flares and was last treated 07/11/2021 with ciprofloxacin and metronidazole.  Reports complete resolution of medication soon after starting antibiotics.  Reports current episode occurred approximately day ago and has been ongoing since that time.  Pain is rated 8 on a 0-10 pain scale, localized to left lower quadrant, described as aching but worse with deep breathing or tenderness to palpation, he has not tried any over-the-counter medications for symptom management.  Reports last bowel movement was earlier today.  Denies any fever, nausea, diarrhea, melena, hematochezia, changes in bowel moods.   Past Medical History:  Diagnosis Date   Anxiety    Chest pain    Diverticulitis    GERD (gastroesophageal reflux disease)    History of cold urticaria    PFO (patent foramen ovale)    PFO (patent foramen ovale)    Sleep apnea    mild sleep apnea    TIA (transient ischemic attack)     Patient Active Problem List   Diagnosis Date Noted   Chest pain 04/07/2014    Past Surgical History:  Procedure Laterality Date   COLON SURGERY     INGUINAL HERNIA REPAIR Right 02/26/2015   Procedure: OPEN RIGHT INGUINAL HERNIA REPAIR ;  Surgeon: Johnathan Hausen, MD;  Location: [facility];  Service: General;  Laterality: Right;  With [place] N/A 02/26/2015   Procedure: MIDLINE SCAR REVISION;  Surgeon: Johnathan Hausen, MD;  Location: [facility];  Service: General;  Laterality: N/A;       Home Medications     Prior to Admission medications   Medication Sig Start Date End Date Taking? Authorizing Provider  amoxicillin-clavulanate (AUGMENTIN) 875-125 MG tablet Take 1 tablet by mouth every 12 (twelve) hours. 09/09/21  Yes Mahad Newstrom K, PA-C  amLODipine (NORVASC) 2.5 MG tablet Take 2.5 mg by mouth daily at 12 noon.    [provider]  aspirin EC 81 MG tablet Take 81 mg by mouth daily at 12 noon.     [provider]  famotidine-calcium carbonate-magnesium hydroxide (PEPCID COMPLETE) 10-800-165 MG chewable tablet Chew 1 tablet by mouth daily as needed (acid reflux/heartburn).    [provider]  Methylcellulose, Laxative, (CITRUCEL PO) Take 4 tablets by mouth daily at 12 noon.    [provider]  Multiple Vitamin (MULTIVITAMIN WITH MINERALS) TABS tablet Take 1 tablet by mouth daily at 12 noon.     [provider]  Probiotic Product (PROBIOTIC PO) Take 1 tablet by mouth daily at 12 noon.    [provider]    Family History History reviewed. No pertinent family history.  Social History Social History   Tobacco Use   Smoking status: Never   Smokeless tobacco: Never  Substance Use Topics   Alcohol use: Yes    Comment: 1-2 drinks daily   Drug use: No     Allergies   Contrast media [iodinated diagnostic agents], Other, Sulfa antibiotics,  and Sulfa drugs cross reactors   Review of Systems Review of Systems  Constitutional:  Positive for activity change. Negative for appetite change, fatigue and fever.  Respiratory:  Negative for cough and shortness of breath.   Cardiovascular:  Negative for chest pain.  Gastrointestinal:  Positive for abdominal pain. Negative for diarrhea, nausea and vomiting.  Musculoskeletal:  Negative for arthralgias and myalgias.  Neurological:  Negative for dizziness, light-headedness and headaches.    Physical Exam Triage Vital Signs ED Triage Vitals  Enc Vitals Group     BP 09/09/21 1519 128/85     Pulse  Rate 09/09/21 1519 64     Resp 09/09/21 1519 16     Temp 09/09/21 1519 98.2 F (36.8 C)     Temp Source 09/09/21 1519 Oral     SpO2 09/09/21 1519 98 %     Weight --      Height --      Head Circumference --      Peak Flow --      Pain Score 09/09/21 1517 8     Pain Loc --      Pain Edu? --      Excl. in [place]? --    No data found.  Updated Vital Signs BP 128/85 (BP Location: Left Arm)   Pulse 64   Temp 98.2 F (36.8 C) (Oral)   Resp 16   SpO2 98%   Visual Acuity Right Eye Distance:   Left Eye Distance:   Bilateral Distance:    Right Eye Near:   Left Eye Near:    Bilateral Near:     Physical Exam Vitals reviewed.  Constitutional:      General: He is awake.     Appearance: Normal appearance. He is well-developed. He is not ill-appearing.     Comments: Very pleasant male appears stated age in no acute distress sitting comfortably in exam room  HENT:     Head: Normocephalic and atraumatic.     Mouth/Throat:     Mouth: Mucous membranes are moist.  Cardiovascular:     Rate and Rhythm: Normal rate and regular rhythm.     Heart sounds: Normal heart sounds, S1 normal and S2 normal. No murmur heard. Pulmonary:     Effort: Pulmonary effort is normal.     Breath sounds: Normal breath sounds. No stridor. No wheezing, rhonchi or rales.     Comments: Clear to auscultation bilaterally Abdominal:     General: A surgical scar is present. Bowel sounds are normal.     Palpations: Abdomen is soft.     Tenderness: There is abdominal tenderness in the left lower quadrant. There is no right CVA tenderness, left CVA tenderness, guarding or rebound.     Comments: Significant tenderness palpation in moderate tenderness palpation in left lower quadrant with no evidence of acute abdomen.  Neurological:     Mental Status: He is alert.  Psychiatric:        Behavior: Behavior is cooperative.     UC Treatments / Results  Labs (all labs ordered are listed, but only abnormal results are  displayed) Labs Reviewed  CBC WITH DIFFERENTIAL/PLATELET  COMPREHENSIVE METABOLIC PANEL  POCT URINALYSIS DIPSTICK, ED / UC    EKG   Radiology No results found.  Procedures Procedures (including critical care time)  Medications Ordered in UC Medications - No data to display  Initial Impression / Assessment and Plan / UC Course  I have reviewed the triage vital signs and  the nursing notes.  Pertinent labs & imaging results that were available during my care of the patient were reviewed by me and considered in my medical decision making (see chart for details).     Vital signs and physical exam reassuring today; no indication for emergent evaluation or imaging.  Given history of diverticulitis with left lower quadrant pain similar to previous presentations of this condition will cover with antibiotics.  Patient was started on Augmentin twice daily for 7 days.  CBC and CMP were obtained today-results pending.  If he has significant leukocytosis or abnormalities on metabolic panel will need to go to the ER.  Recommended bowel rest.  Discussed the importance of following up with GI specialist given recurrent flares and he was given contact information for local provider.  Discussed that we do not have CT capabilities in urgent care and if he has persistent/worsening symptoms including more severe pain he needs to go to the ER for further evaluation and management.  Discussed at length alarm symptoms that warrant emergent evaluation.  Strict return precautions given to which she expressed understanding.  Final Clinical Impressions(s) / UC Diagnoses   Final diagnoses:  LLQ abdominal pain  History of diverticulitis     Discharge Instructions      Your urine was normal.  We will contact you if your lab work is abnormal any change your treatment plan.  Please start Augmentin twice daily.  Try to avoid solid foods for 24 hours and use primarily liquids.  As we discussed, if you have any  worsening symptoms including significant pain, fever, nausea, vomiting, blood in your stool you need to go to the hospital because we do not have CT capabilities.  It is important you follow-up with a GI specialist as soon as possible.     ED Prescriptions     Medication Sig Dispense Auth. Provider   amoxicillin-clavulanate (AUGMENTIN) 875-125 MG tablet Take 1 tablet by mouth every 12 (twelve) hours. 14 tablet Jadynn Epping, Derry Skill, PA-C      PDMP not reviewed this encounter.   Terrilee Croak, PA-C 09/09/21 1634

## 2021-09-09 NOTE — ED Provider Notes (Signed)
[facility] DEPT Provider Note   CSN: [id number] Arrival date & time: 09/09/21  1707     History Chief Complaint  Patient presents with   Abdominal Pain    [name] is a 55 y.o. male.  Patient presents to the emergency department with a chief complaint of left lower quadrant abdominal pain.  He states that the pain started yesterday and has significantly worsened throughout the day today.  He reports significant history for multiple episodes of prior diverticulitis.  States that he has also had prior colon resection.  He states that this pain is worse than he has experienced with his diverticulitis.  He states that oftentimes he is able to manage his symptoms at home, but does not feel that would be possible with this episode.  He states that he has had some subjective fevers, but did not measure temperature.  Denies vomiting.  Was seen in urgent care and was started on Augmentin earlier.  He rates his pain as a 7-8 out of 10 that is worsened with palpation and movement.  The history is provided by the patient. No language interpreter was used.      Past Medical History:  Diagnosis Date   Anxiety    Chest pain    Diverticulitis    GERD (gastroesophageal reflux disease)    History of cold urticaria    PFO (patent foramen ovale)    PFO (patent foramen ovale)    Sleep apnea    mild sleep apnea    TIA (transient ischemic attack)     Patient Active Problem List   Diagnosis Date Noted   Chest pain 04/07/2014    Past Surgical History:  Procedure Laterality Date   COLON SURGERY     INGUINAL HERNIA REPAIR Right 02/26/2015   Procedure: OPEN RIGHT INGUINAL HERNIA REPAIR ;  Surgeon: Johnathan Hausen, MD;  Location: [facility];  Service: General;  Laterality: Right;  With [place] N/A 02/26/2015   Procedure: MIDLINE SCAR REVISION;  Surgeon: Johnathan Hausen, MD;  Location: [facility];  Service: General;  Laterality: N/A;        No family history on file.  Social History   Tobacco Use   Smoking status: Never   Smokeless tobacco: Never  Substance Use Topics   Alcohol use: Yes    Comment: 1-2 drinks daily   Drug use: No    Home Medications Prior to Admission medications   Medication Sig Start Date End Date Taking? Authorizing Provider  amLODipine (NORVASC) 2.5 MG tablet Take 2.5 mg by mouth daily at 12 noon.    [provider]  amoxicillin-clavulanate (AUGMENTIN) 875-125 MG tablet Take 1 tablet by mouth every 12 (twelve) hours. 09/09/21   Raspet, Derry Skill, PA-C  aspirin EC 81 MG tablet Take 81 mg by mouth daily at 12 noon.     [provider]  famotidine-calcium carbonate-magnesium hydroxide (PEPCID COMPLETE) 10-800-165 MG chewable tablet Chew 1 tablet by mouth daily as needed (acid reflux/heartburn).    [provider]  Methylcellulose, Laxative, (CITRUCEL PO) Take 4 tablets by mouth daily at 12 noon.    [provider]  Multiple Vitamin (MULTIVITAMIN WITH MINERALS) TABS tablet Take 1 tablet by mouth daily at 12 noon.     [provider]  Probiotic Product (PROBIOTIC PO) Take 1 tablet by mouth daily at 12 noon.    [provider]    Allergies  Contrast media [iodinated diagnostic agents], Other, Sulfa antibiotics, and Sulfa drugs cross reactors  Review of Systems   Review of Systems  All other systems reviewed and are negative.  Physical Exam Updated Vital Signs BP 140/83   Pulse 65   Temp 98 F (36.7 C) (Oral)   Resp 16   Ht 6' (1.829 m)   Wt 97.5 kg   SpO2 99%   BMI 29.16 kg/m   Physical Exam Vitals and nursing note reviewed.  Constitutional:      Appearance: He is well-developed.  HENT:     Head: Normocephalic and atraumatic.  Eyes:     Conjunctiva/sclera: Conjunctivae normal.  Cardiovascular:     Rate and Rhythm: Normal rate and regular rhythm.     Heart sounds: No murmur heard. Pulmonary:     Effort: Pulmonary effort  is normal. No respiratory distress.     Breath sounds: Normal breath sounds.  Abdominal:     Palpations: Abdomen is soft.     Tenderness: There is abdominal tenderness.     Comments: Left lower abdominal tenderness  Musculoskeletal:        General: Normal range of motion.     Cervical back: Neck supple.  Skin:    General: Skin is warm and dry.  Neurological:     Mental Status: He is alert and oriented to person, place, and time.  Psychiatric:        Mood and Affect: Mood normal.        Behavior: Behavior normal.    ED Results / Procedures / Treatments   Labs (all labs ordered are listed, but only abnormal results are displayed) Labs Reviewed  RESP PANEL BY RT-PCR (FLU A&B, COVID) [id number]    EKG None  Radiology CT Abdomen Pelvis Wo Contrast  Result Date: 09/09/2021 CLINICAL DATA:  Abdominal pain, diverticulitis suspected. EXAM: CT ABDOMEN AND PELVIS WITHOUT CONTRAST TECHNIQUE: Multidetector CT imaging of the abdomen and pelvis was performed following the standard protocol without IV contrast. COMPARISON:  October 30, 2018 FINDINGS: Lower chest: No acute abnormality. Hepatobiliary: Unremarkable noncontrast appearance of the hepatic parenchyma. Gallbladder is unremarkable. No biliary ductal dilation. Pancreas: No pancreatic ductal dilation or evidence of acute inflammation. Spleen: Within normal limits. Adrenals/Urinary Tract: Bilateral adrenal glands are unremarkable. No hydronephrosis. 3.5 cm right renal cyst. Urinary bladder is decompressed. Stomach/Bowel: No enteric contrast was administered. Stomach is unremarkable for degree of distension. No pathologic dilation of small or large bowel. The appendix and terminal ileum are unremarkable. Colonic diverticulosis with acute diverticulitis involving the proximal descending colon. No pneumatosis. Vascular/Lymphatic: Aortic atherosclerosis without abdominal aortic aneurysm. No pathologically enlarged abdominal or pelvic lymph nodes.  Reproductive: Prostate is unremarkable. Other: No walled off fluid collections.  No pneumoperitoneum. Musculoskeletal: Mild thoracolumbar spondylosis. No acute osseous abnormality. IMPRESSION: 1. Acute uncomplicated descending colonic diverticulitis. 2. Aortic Atherosclerosis (ICD10-I70.0). Electronically Signed   By: Dahlia Bailiff M.D.   On: 09/09/2021 18:49    Procedures Procedures   Medications Ordered in ED Medications  Ampicillin-Sulbactam (UNASYN) 3 g in sodium chloride 0.9 % 100 mL IVPB (3 g Intravenous New Bag/Given 09/09/21 2228)  HYDROmorphone (DILAUDID) injection 1 mg (1 mg Intravenous Given 09/09/21 2223)  ondansetron (ZOFRAN) injection 4 mg (4 mg Intravenous Given 09/09/21 2223)    ED Course  I have reviewed the triage vital signs and the nursing notes.  Pertinent labs & imaging results that were available during my care of the patient were reviewed by me and considered in my  medical decision making (see chart for details).    MDM Rules/Calculators/A&P                           Patient here with significant left lower abdominal pain.  Was seen earlier at urgent care and had labs done there.  Had mild leukocytosis.  CT of the abdomen shows acute diverticulitis.  Due to patient's severe pain and significant history, feel that observation in the hospital overnight would be most beneficial.  Appreciate Dr. Flossie Buffy for bringing the patient in the hospital.  Start IV Unasyn.  Final Clinical Impression(s) / ED Diagnoses Final diagnoses:  Diverticulitis    Rx / DC Orders ED Discharge Orders     None        Montine Circle, PA-C 09/09/21 2244    Drenda Freeze, MD 09/12/21 1459

## 2021-09-09 NOTE — ED Triage Notes (Signed)
Pt report left lower quadrat abdominal pain ax 1 day. States the pain is worse when taking a deep breath or making pressure over the area. Denies fever, nausea, diarrhea.

## 2021-09-09 NOTE — ED Triage Notes (Signed)
Pt has a hx of diverticulitis.  Was seen at urgent care earlier and had blood drawn and urine analyzed.  Pt states he left there and got his anbx filled but has had increase in pain that is not tolerable. Pt a&o and ambulatory to triage in NAD.

## 2021-09-09 NOTE — ED Provider Notes (Signed)
Emergency Medicine Provider Triage Evaluation Note  [name] , a 55 y.o. male  was evaluated in triage.  Pt complains of left lower quadrant abdominal pain.  Over the last couple days, no nausea and vomiting.  Patient was seen in urgent care earlier today and had labs and urine.  Came straight from urgent care to get CT to assess for diverticulitis..  Review of Systems  Positive: Left lower quadrant abdominal pain Negative: Nausea, vomiting  Physical Exam  BP (!) 173/90 (BP Location: Right Arm)   Pulse 68   Temp 98 F (36.7 C) (Oral)   Resp 17   SpO2 99%  Gen:   Awake, no distress   Resp:  Normal effort  MSK:   Moves extremities without difficulty  Other:  Left lower quadrant tenderness  Medical Decision Making  Medically screening exam initiated at 5:54 PM.  Appropriate orders placed.  Vinod Mikesell was informed that the remainder of the evaluation will be completed by another provider, this initial triage assessment does not replace that evaluation, and the importance of remaining in the ED until their evaluation is complete.  CT ordered, labs and urine already resulted from urgent care earlier today.  Do not think we need to repeat.   Sherrill Raring, PA-C 09/09/21 1755    Drenda Freeze, MD 09/12/21 [phone]

## 2021-09-09 NOTE — Progress Notes (Signed)
Pharmacy Antibiotic Note  [name] is a 55 y.o. male admitted on 09/09/2021 with medical history significant for history of recurrent diverticulitis s/p partial colectomy, hypertension, OSA on CPAP, GERD and hyperlipidemia who presents for concerns of left lower abdominal pain.Marland Kitchen  Pharmacy has been consulted to dose unasyn for intra-abdominal infection  Plan: Unasyn 3gm IV q6h Pharmacy will sign off and follow peripherally  Height: 6' (182.9 cm) Weight: 97.5 kg (215 lb) IBW/kg (Calculated) : 77.6  Temp (24hrs), Avg:98.1 F (36.7 C), Min:98 F (36.7 C), Max:98.2 F (36.8 C)  Recent Labs  Lab 09/09/21 1535  WBC 11.1*  CREATININE 1.18    Estimated Creatinine Clearance: 85.6 mL/min (by C-G formula based on SCr of 1.18 mg/dL).    Allergies  Allergen Reactions   Contrast Media [Iodinated Diagnostic Agents] Anaphylaxis    Reaction to "gastroview"   Other Anaphylaxis    Reaction to "gastroview"   Sulfa Antibiotics Hives and Rash    Child hood reaction    Sulfa Drugs Cross Reactors Hives and Rash    Childhood allergy     Thank you for allowing pharmacy to be a part of this patient's care.  Dolly Rias RPh 09/09/2021, 11:31 PM

## 2021-09-09 NOTE — H&P (Addendum)
History and Physical    Eragon Hammond LKG:[id number] DOB: December 09, 1965 DOA: 09/09/2021  PCP: Vernie Shanks, MD  Patient coming from: Home   I have personally briefly reviewed patient's old medical records in [place]  Chief Complaint: Abdominal pain  HPI: [name] is a 55 y.o. male with medical history significant for history of recurrent diverticulitis s/p partial colectomy, hypertension, OSA on CPAP, GERD and hyperlipidemia who presents for concerns of left lower abdominal pain.  Patient reports that s/p partial colectomy about 10 years ago he has not had any diverticulitis flare up until recently.  He was evaluated in urgent care in September and treated on Cipro and Flagyl.  He then presented to urgent care today since this left lower quadrant pain is the worst he is ever felt and was prescribed Augmentin.  However due to worsening pain he decided to present to the ED.  No nausea, vomiting or diarrhea.  No fever.  Patient follows with a GI regularly and has upcoming colonoscopy scheduled  ED Course: He was afebrile, initially hypertensive to systolic of 664Q, heart rate of 65 on room air.  Has mild leukocytosis of 11K, hemoglobin 15.8.  CMP unremarkable.  CT of the abdomen/pelvis shows acute uncomplicated descending colonic diverticulitis.  Review of Systems: Constitutional: No Weight Change, No Fever ENT/Mouth: No sore throat, No Rhinorrhea Eyes: No Vision Changes Cardiovascular: No Chest Pain, no SOB Respiratory: No Cough, No Sputum, No Wheezing, no Dyspnea  Gastrointestinal: No Nausea, No Vomiting, No Diarrhea, No Constipation, + Pain Genitourinary: no Urinary Incontinence, Musculoskeletal: No Arthralgias, No Myalgias Skin: No Skin Lesions, No Pruritus, Neuro: no Weakness, No Numbness Psych: No Anxiety/Panic, No Depression, no decrease appetite Heme/Lymph: No Bruising, No Bleeding  Social Fianc at bedside.  Denies alcohol or illicit drug use.   Drinks about 2 glasses of wine or liquor nightly.  Past Medical History:  Diagnosis Date   Anxiety    Chest pain    Diverticulitis    GERD (gastroesophageal reflux disease)    History of cold urticaria    PFO (patent foramen ovale)    PFO (patent foramen ovale)    Sleep apnea    mild sleep apnea    TIA (transient ischemic attack)     Past Surgical History:  Procedure Laterality Date   COLON SURGERY     INGUINAL HERNIA REPAIR Right 02/26/2015   Procedure: OPEN RIGHT INGUINAL HERNIA REPAIR ;  Surgeon: Johnathan Hausen, MD;  Location: [facility];  Service: General;  Laterality: Right;  With [place] N/A 02/26/2015   Procedure: MIDLINE SCAR REVISION;  Surgeon: Johnathan Hausen, MD;  Location: [facility];  Service: General;  Laterality: N/A;     reports that he has never smoked. He has never used smokeless tobacco. He reports current alcohol use. He reports that he does not use drugs. Social History  Allergies  Allergen Reactions   Contrast Media [Iodinated Diagnostic Agents] Anaphylaxis    Reaction to "gastroview"   Other Anaphylaxis    Reaction to "gastroview"   Sulfa Antibiotics Hives and Rash    Child hood reaction    Sulfa Drugs Cross Reactors Hives and Rash    Childhood allergy       Prior to Admission medications   Medication Sig Start Date End Date Taking? Authorizing Provider  amLODipine (NORVASC) 2.5 MG tablet Take 2.5 mg by mouth daily at 12 noon.    [provider]  amoxicillin-clavulanate (AUGMENTIN) 875-125 MG tablet Take 1 tablet by mouth every 12 (twelve) hours. 09/09/21   Raspet, Derry Skill, PA-C  aspirin EC 81 MG tablet Take 81 mg by mouth daily at 12 noon.     [provider]  famotidine-calcium carbonate-magnesium hydroxide (PEPCID COMPLETE) 10-800-165 MG chewable tablet Chew 1 tablet by mouth daily as needed (acid reflux/heartburn).    [provider]  Methylcellulose, Laxative, (CITRUCEL PO) Take 4 tablets by  mouth daily at 12 noon.    [provider]  Multiple Vitamin (MULTIVITAMIN WITH MINERALS) TABS tablet Take 1 tablet by mouth daily at 12 noon.     [provider]  Probiotic Product (PROBIOTIC PO) Take 1 tablet by mouth daily at 12 noon.    [provider]    Physical Exam: Vitals:   09/09/21 1740 09/09/21 2040 09/09/21 2203  BP: (!) 173/90 (!) 141/94 140/83  Pulse: 68 65 65  Resp: 17 16 16   Temp: 98 F (36.7 C)    TempSrc: Oral    SpO2: 99% 99% 99%  Weight:  97.5 kg   Height:  6' (1.829 m)     Constitutional: NAD, calm, comfortable, nontoxic appearing middle-age male laying flat in bed Vitals:   09/09/21 1740 09/09/21 2040 09/09/21 2203  BP: (!) 173/90 (!) 141/94 140/83  Pulse: 68 65 65  Resp: 17 16 16   Temp: 98 F (36.7 C)    TempSrc: Oral    SpO2: 99% 99% 99%  Weight:  97.5 kg   Height:  6' (1.829 m)    Eyes: PERRL, lids and conjunctivae normal ENMT: Mucous membranes are moist.  Neck: normal, supple Respiratory: clear to auscultation bilaterally, no wheezing, no crackles. Normal respiratory effort.  Cardiovascular: Regular rate and rhythm, no murmurs / rubs / gallops. No extremity edema.  Abdomen: Soft, mild distention, no tenderness following IV opioid, no guarding, rigidity or rebound tenderness bowel sounds positive.  Musculoskeletal: no clubbing / cyanosis. No joint deformity upper and lower extremities. Good ROM, no contractures. Normal muscle tone.  Skin: no rashes, lesions, ulcers. No induration Neurologic: CN 2-12 grossly intact. Sensation intact, Strength 5/5 in all 4.  Psychiatric: Normal judgment and insight. Alert and oriented x 3. Normal mood.    Labs on Admission: I have personally reviewed following labs and imaging studies  CBC: Recent Labs  Lab 09/09/21 1535  WBC 11.1*  NEUTROABS 8.0*  HGB 15.8  HCT 45.2  MCV 92.4  PLT 109   Basic Metabolic Panel: Recent Labs  Lab 09/09/21 1535  NA 137  K 3.9  CL 104  CO2  27  GLUCOSE 84  BUN 10  CREATININE 1.18  CALCIUM 9.1   GFR: Estimated Creatinine Clearance: 85.6 mL/min (by C-G formula based on SCr of 1.18 mg/dL). Liver Function Tests: Recent Labs  Lab 09/09/21 1535  AST 24  ALT 24  ALKPHOS 67  BILITOT 2.6*  PROT 7.1  ALBUMIN 4.3   No results for input(s): LIPASE, AMYLASE in the last 168 hours. No results for input(s): AMMONIA in the last 168 hours. Coagulation Profile: No results for input(s): INR, PROTIME in the last 168 hours. Cardiac Enzymes: No results for input(s): CKTOTAL, CKMB, CKMBINDEX, TROPONINI in the last 168 hours. BNP (last 3 results) No results for input(s): PROBNP in the last 8760 hours. HbA1C: No results for input(s): HGBA1C in the last 72 hours. CBG: No results for input(s): GLUCAP in the last 168 hours. Lipid Profile: No results for input(s): CHOL, HDL,  LDLCALC, TRIG, CHOLHDL, LDLDIRECT in the last 72 hours. Thyroid Function Tests: No results for input(s): TSH, T4TOTAL, FREET4, T3FREE, THYROIDAB in the last 72 hours. Anemia Panel: No results for input(s): VITAMINB12, FOLATE, FERRITIN, TIBC, IRON, RETICCTPCT in the last 72 hours. Urine analysis:    Component Value Date/Time   COLORURINE YELLOW 10/30/2018 1926   APPEARANCEUR CLEAR 10/30/2018 1926   LABSPEC 1.020 09/09/2021 1617   PHURINE 6.5 09/09/2021 1617   GLUCOSEU NEGATIVE 09/09/2021 1617   HGBUR NEGATIVE 09/09/2021 1617   BILIRUBINUR NEGATIVE 09/09/2021 1617   KETONESUR NEGATIVE 09/09/2021 1617   PROTEINUR NEGATIVE 09/09/2021 1617   UROBILINOGEN 0.2 09/09/2021 1617   NITRITE NEGATIVE 09/09/2021 1617   LEUKOCYTESUR NEGATIVE 09/09/2021 1617    Radiological Exams on Admission: CT Abdomen Pelvis Wo Contrast  Result Date: 09/09/2021 CLINICAL DATA:  Abdominal pain, diverticulitis suspected. EXAM: CT ABDOMEN AND PELVIS WITHOUT CONTRAST TECHNIQUE: Multidetector CT imaging of the abdomen and pelvis was performed following the standard protocol without IV  contrast. COMPARISON:  October 30, 2018 FINDINGS: Lower chest: No acute abnormality. Hepatobiliary: Unremarkable noncontrast appearance of the hepatic parenchyma. Gallbladder is unremarkable. No biliary ductal dilation. Pancreas: No pancreatic ductal dilation or evidence of acute inflammation. Spleen: Within normal limits. Adrenals/Urinary Tract: Bilateral adrenal glands are unremarkable. No hydronephrosis. 3.5 cm right renal cyst. Urinary bladder is decompressed. Stomach/Bowel: No enteric contrast was administered. Stomach is unremarkable for degree of distension. No pathologic dilation of small or large bowel. The appendix and terminal ileum are unremarkable. Colonic diverticulosis with acute diverticulitis involving the proximal descending colon. No pneumatosis. Vascular/Lymphatic: Aortic atherosclerosis without abdominal aortic aneurysm. No pathologically enlarged abdominal or pelvic lymph nodes. Reproductive: Prostate is unremarkable. Other: No walled off fluid collections.  No pneumoperitoneum. Musculoskeletal: Mild thoracolumbar spondylosis. No acute osseous abnormality. IMPRESSION: 1. Acute uncomplicated descending colonic diverticulitis. 2. Aortic Atherosclerosis (ICD10-I70.0). Electronically Signed   By: Dahlia Bailiff M.D.   On: 09/09/2021 18:49      Assessment/Plan  Acute uncomplicated diverticulitis -Patient has history of recurrent diverticulitis s/p partial colectomy - Continue IV Unasyn - N.p.o. for bowel rest - PRN IV morphine and Dilaudid for pain control   Hypertension - Continue home amlodipine  OSA Continue CPAP nightly  DVT prophylaxis:.Lovenox Code Status: Full Family Communication: Plan discussed with patient and fianc at bedside  disposition Plan: Home with observation Consults called:  Admission status: Observation  Level of care: Med-Surg  Status is: Observation  The patient remains OBS appropriate and will d/c before 2 midnights.        Orene Desanctis  DO [facility]   If 7PM-7AM, please contact night-coverage [email or web address]   09/09/2021, 10:47 PM

## 2021-09-09 NOTE — Discharge Instructions (Signed)
Your urine was normal.  We will contact you if your lab work is abnormal any change your treatment plan.  Please start Augmentin twice daily.  Try to avoid solid foods for 24 hours and use primarily liquids.  As we discussed, if you have any worsening symptoms including significant pain, fever, nausea, vomiting, blood in your stool you need to go to the hospital because we do not have CT capabilities.  It is important you follow-up with a GI specialist as soon as possible.

## 2021-09-10 DIAGNOSIS — K5792 Diverticulitis of intestine, part unspecified, without perforation or abscess without bleeding: Secondary | ICD-10-CM | POA: Diagnosis not present

## 2021-09-10 LAB — CBC
HCT: 42.1 % (ref 39.0–52.0)
Hemoglobin: 14.4 g/dL (ref 13.0–17.0)
MCH: 32.2 pg (ref 26.0–34.0)
MCHC: 34.2 g/dL (ref 30.0–36.0)
MCV: 94.2 fL (ref 80.0–100.0)
Platelets: 145 10*3/uL — ABNORMAL LOW (ref 150–400)
RBC: 4.47 MIL/uL (ref 4.22–5.81)
RDW: 12.1 % (ref 11.5–15.5)
WBC: 7.8 10*3/uL (ref 4.0–10.5)
nRBC: 0 % (ref 0.0–0.2)

## 2021-09-10 MED ORDER — HYDROCODONE-ACETAMINOPHEN 5-325 MG PO TABS
1.0000 | ORAL_TABLET | ORAL | Status: DC | PRN
  Administered 2021-09-10: 1 via ORAL
  Filled 2021-09-10: qty 1

## 2021-09-10 MED ORDER — HYDROCODONE-ACETAMINOPHEN 5-325 MG PO TABS
1.0000 | ORAL_TABLET | ORAL | 0 refills | Status: AC | PRN
Start: 2021-09-10 — End: ?

## 2021-09-10 MED ORDER — AMLODIPINE BESYLATE 5 MG PO TABS
2.5000 mg | ORAL_TABLET | Freq: Every day | ORAL | Status: DC
  Administered 2021-09-10: 2.5 mg via ORAL
  Filled 2021-09-10: qty 1

## 2021-09-10 NOTE — Plan of Care (Signed)

## 2021-09-10 NOTE — Discharge Summary (Signed)
Physician Discharge Summary  [name] LTJ:[id number] DOB: [birth year] DOA: 09/09/2021  PCP: Vernie Shanks, MD  Admit date: 09/09/2021 Discharge date: 09/10/2021  Admitted From: Home Disposition: Home  Recommendations for Outpatient Follow-up:  Follow up with PCP in 1-2 weeks Please obtain BMP/CBC in one week  Home Health: None Equipment/Devices: None  Discharge Condition: Stable CODE STATUS: Full Diet recommendation: Soft diet as tolerated  Brief/Interim Summary: [name] is a 55 y.o. male with medical history significant for history of recurrent diverticulitis s/p partial colectomy, hypertension, OSA on CPAP, GERD and hyperlipidemia who presents for concerns of left lower abdominal pain imaging consistent with diverticulitis.  Patient improved with IV antibiotics pain control and n.p.o. status, now advancing diet and tolerating quite well.  Otherwise stable and agreeable for discharge home on remainder of Augmentin, pain control with hydrocodone.  Close follow-up with PCP versus GI in the next 1 to 2 weeks  Discharge Diagnoses:  Principal Problem:   Acute diverticulitis Active Problems:   HTN (hypertension)    Discharge Instructions   Allergies as of 09/10/2021       Reactions   Other Anaphylaxis   Reaction to "gastroview"   Sulfa Antibiotics Hives, Rash   Child hood reaction   Sulfa Drugs Cross Reactors Hives, Rash   Childhood allergy        Medication List     TAKE these medications    amLODipine 2.5 MG tablet Commonly known as: NORVASC Take 2.5 mg by mouth daily at 12 noon.   amoxicillin-clavulanate 875-125 MG tablet Commonly known as: AUGMENTIN Take 1 tablet by mouth every 12 (twelve) hours.   aspirin EC 81 MG tablet Take 81 mg by mouth daily at 12 noon.   CITRUCEL PO Take 4 tablets by mouth daily at 12 noon.   famotidine-calcium carbonate-magnesium hydroxide 10-800-165 MG chewable tablet Commonly known as: PEPCID  COMPLETE Chew 1 tablet by mouth daily as needed (acid reflux/heartburn).   HYDROcodone-acetaminophen 5-325 MG tablet Commonly known as: NORCO/VICODIN Take 1-2 tablets by mouth every 4 (four) hours as needed for severe pain.   multivitamin with minerals Tabs tablet Take 1 tablet by mouth daily at 12 noon.   PROBIOTIC PO Take 1 tablet by mouth daily at 12 noon.        Allergies  Allergen Reactions   Other Anaphylaxis    Reaction to "gastroview"   Sulfa Antibiotics Hives and Rash    Child hood reaction    Sulfa Drugs Cross Reactors Hives and Rash    Childhood allergy    Procedures/Studies: CT Abdomen Pelvis Wo Contrast  Result Date: 09/09/2021 CLINICAL DATA:  Abdominal pain, diverticulitis suspected. EXAM: CT ABDOMEN AND PELVIS WITHOUT CONTRAST TECHNIQUE: Multidetector CT imaging of the abdomen and pelvis was performed following the standard protocol without IV contrast. COMPARISON:  October 30, 2018 FINDINGS: Lower chest: No acute abnormality. Hepatobiliary: Unremarkable noncontrast appearance of the hepatic parenchyma. Gallbladder is unremarkable. No biliary ductal dilation. Pancreas: No pancreatic ductal dilation or evidence of acute inflammation. Spleen: Within normal limits. Adrenals/Urinary Tract: Bilateral adrenal glands are unremarkable. No hydronephrosis. 3.5 cm right renal cyst. Urinary bladder is decompressed. Stomach/Bowel: No enteric contrast was administered. Stomach is unremarkable for degree of distension. No pathologic dilation of small or large bowel. The appendix and terminal ileum are unremarkable. Colonic diverticulosis with acute diverticulitis involving the proximal descending colon. No pneumatosis. Vascular/Lymphatic: Aortic atherosclerosis without abdominal aortic aneurysm. No pathologically enlarged abdominal or pelvic lymph nodes. Reproductive: Prostate is unremarkable. Other: No walled  off fluid collections.  No pneumoperitoneum. Musculoskeletal: Mild  thoracolumbar spondylosis. No acute osseous abnormality. IMPRESSION: 1. Acute uncomplicated descending colonic diverticulitis. 2. Aortic Atherosclerosis (ICD10-I70.0). Electronically Signed   By: Dahlia Bailiff M.D.   On: 09/09/2021 18:49     Subjective: No acute issues or events overnight, tolerating p.o. quite well, pain controlled with hydrocodone otherwise denies nausea vomiting diarrhea constipation headache fevers chills chest pain   Discharge Exam: Vitals:   09/10/21 0852 09/10/21 1306  BP: 120/78 125/70  Pulse: (!) 59 65  Resp: 20 16  Temp: 99 F (37.2 C) 99.9 F (37.7 C)  SpO2: 97% 97%   Vitals:   09/10/21 0109 09/10/21 0434 09/10/21 0852 09/10/21 1306  BP:  118/74 120/78 125/70  Pulse: 62 61 (!) 59 65  Resp: 16 18 20 16   Temp:  98.6 F (37 C) 99 F (37.2 C) 99.9 F (37.7 C)  TempSrc:  Oral Oral Oral  SpO2: 97% 94% 97% 97%  Weight:      Height:        General: Pt is alert, awake, not in acute distress Cardiovascular: RRR, S1/S2 +, no rubs, no gallops Respiratory: CTA bilaterally, no wheezing, no rhonchi Abdominal: Soft, NT, ND, bowel sounds + Extremities: no edema, no cyanosis    The results of significant diagnostics from this hospitalization (including imaging, microbiology, ancillary and laboratory) are listed below for reference.     Microbiology: Recent Results (from the past 240 hour(s))  Resp Panel by RT-PCR (Flu A&B, Covid) Nasopharyngeal Swab     Status: None   Collection Time: 09/09/21 10:12 PM   Specimen: Nasopharyngeal Swab; Nasopharyngeal(NP) swabs in vial transport medium  Result Value Ref Range Status   SARS Coronavirus 2 by RT PCR NEGATIVE NEGATIVE Final    Comment: (NOTE) SARS-CoV-2 target nucleic acids are NOT DETECTED.  The SARS-CoV-2 RNA is generally detectable in upper respiratory specimens during the acute phase of infection. The lowest concentration of SARS-CoV-2 viral copies this assay can detect is 138 copies/mL. A negative  result does not preclude SARS-Cov-2 infection and should not be used as the sole basis for treatment or other patient management decisions. A negative result may occur with  improper specimen collection/handling, submission of specimen other than nasopharyngeal swab, presence of viral mutation(s) within the areas targeted by this assay, and inadequate number of viral copies(<138 copies/mL). A negative result must be combined with clinical observations, patient history, and epidemiological information. The expected result is Negative.  Fact Sheet for Patients:  EntrepreneurPulse.com.au  Fact Sheet for Healthcare Providers:  IncredibleEmployment.be  This test is no t yet approved or cleared by the [place] FDA and  has been authorized for detection and/or diagnosis of SARS-CoV-2 by FDA under an Emergency Use Authorization (EUA). This EUA will remain  in effect (meaning this test can be used) for the duration of the COVID-19 declaration under Section 564(b)(1) of the Act, 21 U.S.C.section 360bbb-3(b)(1), unless the authorization is terminated  or revoked sooner.       Influenza A by PCR NEGATIVE NEGATIVE Final   Influenza B by PCR NEGATIVE NEGATIVE Final    Comment: (NOTE) The Xpert Xpress SARS-CoV-2/FLU/RSV plus assay is intended as an aid in the diagnosis of influenza from Nasopharyngeal swab specimens and should not be used as a sole basis for treatment. Nasal washings and aspirates are unacceptable for Xpert Xpress SARS-CoV-2/FLU/RSV testing.  Fact Sheet for Patients: EntrepreneurPulse.com.au  Fact Sheet for Healthcare Providers: IncredibleEmployment.be  This test is not yet  approved or cleared by the [place] and has been authorized for detection and/or diagnosis of SARS-CoV-2 by FDA under an Emergency Use Authorization (EUA). This EUA will remain in effect (meaning this test can be used)  for the duration of the COVID-19 declaration under Section 564(b)(1) of the Act, 21 U.S.C. section 360bbb-3(b)(1), unless the authorization is terminated or revoked.  Performed at [facility], [place] [address], [place], [place] [address]      Labs: BNP (last 3 results) No results for input(s): BNP in the last 8760 hours. Basic Metabolic Panel: Recent Labs  Lab 09/09/21 1535  NA 137  K 3.9  CL 104  CO2 27  GLUCOSE 84  BUN 10  CREATININE 1.18  CALCIUM 9.1   Liver Function Tests: Recent Labs  Lab 09/09/21 1535  AST 24  ALT 24  ALKPHOS 67  BILITOT 2.6*  PROT 7.1  ALBUMIN 4.3   No results for input(s): LIPASE, AMYLASE in the last 168 hours. No results for input(s): AMMONIA in the last 168 hours. CBC: Recent Labs  Lab 09/09/21 1535 09/10/21 0538  WBC 11.1* 7.8  NEUTROABS 8.0*  --   HGB 15.8 14.4  HCT 45.2 42.1  MCV 92.4 94.2  PLT 194 145*   Cardiac Enzymes: No results for input(s): CKTOTAL, CKMB, CKMBINDEX, TROPONINI in the last 168 hours. BNP: Invalid input(s): POCBNP CBG: No results for input(s): GLUCAP in the last 168 hours. D-Dimer No results for input(s): DDIMER in the last 72 hours. Hgb A1c No results for input(s): HGBA1C in the last 72 hours. Lipid Profile No results for input(s): CHOL, HDL, LDLCALC, TRIG, CHOLHDL, LDLDIRECT in the last 72 hours. Thyroid function studies No results for input(s): TSH, T4TOTAL, T3FREE, THYROIDAB in the last 72 hours.  Invalid input(s): FREET3 Anemia work up No results for input(s): VITAMINB12, FOLATE, FERRITIN, TIBC, IRON, RETICCTPCT in the last 72 hours. Urinalysis    Component Value Date/Time   COLORURINE YELLOW 10/30/2018 1926   APPEARANCEUR CLEAR 10/30/2018 1926   LABSPEC 1.020 09/09/2021 1617   PHURINE 6.5 09/09/2021 1617   GLUCOSEU NEGATIVE 09/09/2021 1617   HGBUR NEGATIVE 09/09/2021 1617   BILIRUBINUR NEGATIVE 09/09/2021 1617   KETONESUR NEGATIVE 09/09/2021 1617   PROTEINUR  NEGATIVE 09/09/2021 1617   UROBILINOGEN 0.2 09/09/2021 1617   NITRITE NEGATIVE 09/09/2021 1617   LEUKOCYTESUR NEGATIVE 09/09/2021 1617   Sepsis Labs Invalid input(s): PROCALCITONIN,  WBC,  LACTICIDVEN Microbiology Recent Results (from the past 240 hour(s))  Resp Panel by RT-PCR (Flu A&B, Covid) Nasopharyngeal Swab     Status: None   Collection Time: 09/09/21 10:12 PM   Specimen: Nasopharyngeal Swab; Nasopharyngeal(NP) swabs in vial transport medium  Result Value Ref Range Status   SARS Coronavirus 2 by RT PCR NEGATIVE NEGATIVE Final    Comment: (NOTE) SARS-CoV-2 target nucleic acids are NOT DETECTED.  The SARS-CoV-2 RNA is generally detectable in upper respiratory specimens during the acute phase of infection. The lowest concentration of SARS-CoV-2 viral copies this assay can detect is 138 copies/mL. A negative result does not preclude SARS-Cov-2 infection and should not be used as the sole basis for treatment or other patient management decisions. A negative result may occur with  improper specimen collection/handling, submission of specimen other than nasopharyngeal swab, presence of viral mutation(s) within the areas targeted by this assay, and inadequate number of viral copies(<138 copies/mL). A negative result must be combined with clinical observations, patient history, and epidemiological information. The expected result is Negative.  Fact  Sheet for Patients:  EntrepreneurPulse.com.au  Fact Sheet for Healthcare Providers:  IncredibleEmployment.be  This test is no t yet approved or cleared by the [place] FDA and  has been authorized for detection and/or diagnosis of SARS-CoV-2 by FDA under an Emergency Use Authorization (EUA). This EUA will remain  in effect (meaning this test can be used) for the duration of the COVID-19 declaration under Section 564(b)(1) of the Act, 21 U.S.C.section 360bbb-3(b)(1), unless the authorization is  terminated  or revoked sooner.       Influenza A by PCR NEGATIVE NEGATIVE Final   Influenza B by PCR NEGATIVE NEGATIVE Final    Comment: (NOTE) The Xpert Xpress SARS-CoV-2/FLU/RSV plus assay is intended as an aid in the diagnosis of influenza from Nasopharyngeal swab specimens and should not be used as a sole basis for treatment. Nasal washings and aspirates are unacceptable for Xpert Xpress SARS-CoV-2/FLU/RSV testing.  Fact Sheet for Patients: EntrepreneurPulse.com.au  Fact Sheet for Healthcare Providers: IncredibleEmployment.be  This test is not yet approved or cleared by the [place] FDA and has been authorized for detection and/or diagnosis of SARS-CoV-2 by FDA under an Emergency Use Authorization (EUA). This EUA will remain in effect (meaning this test can be used) for the duration of the COVID-19 declaration under Section 564(b)(1) of the Act, 21 U.S.C. section 360bbb-3(b)(1), unless the authorization is terminated or revoked.  Performed at [facility], [place] [address], [place], [place] [address]      Time coordinating discharge: Over 30 minutes  SIGNED:   Little Ishikawa, DO [facility] 09/10/2021, 3:32 PM Pager   If 7PM-7AM, please contact night-coverage [email or web address]

## 2021-09-10 NOTE — Progress Notes (Signed)
Pt discharged home in stable condition. Discharge instructions given. Pt verbalized understanding. No immediate questions or concerns at this time. Discharged from unit via wheelchair.  

## 2021-10-19 ENCOUNTER — Ambulatory Visit (INDEPENDENT_AMBULATORY_CARE_PROVIDER_SITE_OTHER): Payer: No Typology Code available for payment source

## 2021-10-19 ENCOUNTER — Other Ambulatory Visit: Payer: Self-pay

## 2021-10-19 ENCOUNTER — Ambulatory Visit: Payer: No Typology Code available for payment source | Admitting: Podiatry

## 2021-10-19 DIAGNOSIS — M722 Plantar fascial fibromatosis: Secondary | ICD-10-CM | POA: Diagnosis not present

## 2021-10-19 DIAGNOSIS — F439 Reaction to severe stress, unspecified: Secondary | ICD-10-CM | POA: Insufficient documentation

## 2021-10-19 DIAGNOSIS — F4320 Adjustment disorder, unspecified: Secondary | ICD-10-CM | POA: Insufficient documentation

## 2021-10-19 DIAGNOSIS — R030 Elevated blood-pressure reading, without diagnosis of hypertension: Secondary | ICD-10-CM | POA: Insufficient documentation

## 2021-10-19 DIAGNOSIS — M7752 Other enthesopathy of left foot: Secondary | ICD-10-CM

## 2021-10-19 DIAGNOSIS — K5732 Diverticulitis of large intestine without perforation or abscess without bleeding: Secondary | ICD-10-CM | POA: Insufficient documentation

## 2021-10-19 DIAGNOSIS — G4733 Obstructive sleep apnea (adult) (pediatric): Secondary | ICD-10-CM | POA: Insufficient documentation

## 2021-10-19 DIAGNOSIS — Z0189 Encounter for other specified special examinations: Secondary | ICD-10-CM | POA: Insufficient documentation

## 2021-10-19 DIAGNOSIS — H9313 Tinnitus, bilateral: Secondary | ICD-10-CM | POA: Insufficient documentation

## 2021-10-19 DIAGNOSIS — N5089 Other specified disorders of the male genital organs: Secondary | ICD-10-CM | POA: Insufficient documentation

## 2021-10-19 DIAGNOSIS — D126 Benign neoplasm of colon, unspecified: Secondary | ICD-10-CM | POA: Insufficient documentation

## 2021-10-19 DIAGNOSIS — E7800 Pure hypercholesterolemia, unspecified: Secondary | ICD-10-CM | POA: Insufficient documentation

## 2021-10-19 DIAGNOSIS — K219 Gastro-esophageal reflux disease without esophagitis: Secondary | ICD-10-CM | POA: Insufficient documentation

## 2021-10-19 DIAGNOSIS — R0989 Other specified symptoms and signs involving the circulatory and respiratory systems: Secondary | ICD-10-CM | POA: Insufficient documentation

## 2021-10-19 DIAGNOSIS — M79671 Pain in right foot: Secondary | ICD-10-CM

## 2021-10-19 DIAGNOSIS — B009 Herpesviral infection, unspecified: Secondary | ICD-10-CM | POA: Insufficient documentation

## 2021-10-19 DIAGNOSIS — K4090 Unilateral inguinal hernia, without obstruction or gangrene, not specified as recurrent: Secondary | ICD-10-CM | POA: Insufficient documentation

## 2021-10-19 DIAGNOSIS — R899 Unspecified abnormal finding in specimens from other organs, systems and tissues: Secondary | ICD-10-CM | POA: Insufficient documentation

## 2021-10-19 MED ORDER — METHYLPREDNISOLONE 4 MG PO TBPK: ORAL_TABLET | ORAL | 0 refills | Status: AC

## 2021-10-19 NOTE — Progress Notes (Signed)
° °  Subjective: 55 y.o. male presenting today as a new patient for evaluation of right heel pain and pain to the ball of the foot left.  This is on been ongoing for quite some time.  Gradual onset.  He denies a history of injury.  He presents for further treatment and evaluation   Past Medical History:  Diagnosis Date   Anxiety    Chest pain    Diverticulitis    GERD (gastroesophageal reflux disease)    History of cold urticaria    PFO (patent foramen ovale)    PFO (patent foramen ovale)    Sleep apnea    mild sleep apnea    TIA (transient ischemic attack)    Past Surgical History:  Procedure Laterality Date   COLON SURGERY     INGUINAL HERNIA REPAIR Right 02/26/2015   Procedure: OPEN RIGHT INGUINAL HERNIA REPAIR ;  Surgeon: Johnathan Hausen, MD;  Location: [facility];  Service: General;  Laterality: Right;  With [place] N/A 02/26/2015   Procedure: MIDLINE SCAR REVISION;  Surgeon: Johnathan Hausen, MD;  Location: [facility];  Service: General;  Laterality: N/A;   Allergies  Allergen Reactions   Other Anaphylaxis    Reaction to "gastroview"   Sulfa Antibiotics Hives and Rash    Child hood reaction    Sulfa Drugs Cross Reactors Hives and Rash    Childhood allergy     Objective: Physical Exam General: The patient is alert and oriented x3 in no acute distress.  Dermatology: Skin is warm, dry and supple bilateral lower extremities. Negative for open lesions or macerations bilateral.   Vascular: Dorsalis Pedis and Posterior Tibial pulses palpable bilateral.  Capillary fill time is immediate to all digits.  Neurological: Epicritic and protective threshold intact bilateral.   Musculoskeletal: Tenderness to palpation to the plantar aspect of the right heel along the plantar fascia.  There is also pain on palpation and range of motion of the second MTP joint of the left foot.  All other joints range of motion within normal limits bilateral. Strength 5/5 in all  groups bilateral.   Radiographic exam: Normal osseous mineralization. Joint spaces preserved. No fracture/dislocation/boney destruction. No other soft tissue abnormalities or radiopaque foreign bodies.  Otherwise normal exam  Assessment: 1. Plantar fasciitis right 2.  Second MTP capsulitis left  Plan of Care:  1. Patient evaluated. Xrays reviewed.   2. Injection of 0.5cc Celestone soluspan injected into the right plantar fascia and second MTP left 3. Rx for Medrol Dose Pack placed 4.  Patient declined oral NSAIDs 5.  Continue wearing Hoka's 6.  Discontinue treadmill.  Patient believes some of his pain may have come from the increased treadmill use.  Recommend nonimpact cardio exercises including bike and possible elliptical 7.  Return to clinic as needed   Edrick Kins, DPM [facility]  Dr. Edrick Kins, DPM    [address], [place] [address]                Office [phone]  Fax [phone]

## 2021-10-25 DIAGNOSIS — M7752 Other enthesopathy of left foot: Secondary | ICD-10-CM | POA: Diagnosis not present

## 2021-10-25 DIAGNOSIS — M722 Plantar fascial fibromatosis: Secondary | ICD-10-CM | POA: Diagnosis not present

## 2021-10-25 MED ORDER — BETAMETHASONE SOD PHOS & ACET 6 (3-3) MG/ML IJ SUSP
3.0000 mg | Freq: Once | INTRAMUSCULAR | Status: AC
  Administered 2021-10-25: 19:00:00 3 mg via INTRA_ARTICULAR

## 2022-07-12 ENCOUNTER — Ambulatory Visit: Payer: No Typology Code available for payment source | Admitting: Family Medicine

## 2022-07-12 VITALS — BP 124/80 | Ht 72.0 in | Wt 220.0 lb

## 2022-07-12 DIAGNOSIS — M7742 Metatarsalgia, left foot: Secondary | ICD-10-CM | POA: Diagnosis not present

## 2022-07-12 DIAGNOSIS — R269 Unspecified abnormalities of gait and mobility: Secondary | ICD-10-CM

## 2022-07-12 DIAGNOSIS — M722 Plantar fascial fibromatosis: Secondary | ICD-10-CM

## 2022-07-13 ENCOUNTER — Encounter: Payer: Self-pay | Admitting: Family Medicine

## 2022-07-13 NOTE — Progress Notes (Signed)
PCP: Vernie Shanks, MD (Inactive)  Subjective:   HPI: Patient is a 56 y.o. male here for custom orthotics.  Patient referred by Dr. Vanessa Keyport for consideration of custom orthotics. He has history of plantar fasciitis on right, left forefoot pain on left. Reports his arches fell last year which he attributes to working out very hard in [employer] classes.  Past Medical History:  Diagnosis Date   Anxiety    Chest pain    Diverticulitis    GERD (gastroesophageal reflux disease)    History of cold urticaria    PFO (patent foramen ovale)    PFO (patent foramen ovale)    Sleep apnea    mild sleep apnea    TIA (transient ischemic attack)     Current Outpatient Medications on File Prior to Visit  Medication Sig Dispense Refill   acyclovir ointment (ZOVIRAX) 5 % 1 application every 3 hours     amLODipine (NORVASC) 2.5 MG tablet Take 2.5 mg by mouth daily at 12 noon.     amLODipine (NORVASC) 2.5 MG tablet Take 1 tablet by mouth daily.     amoxicillin-clavulanate (AUGMENTIN) 875-125 MG tablet Take 1 tablet by mouth every 12 (twelve) hours. 14 tablet 0   aspirin EC 81 MG tablet Take 81 mg by mouth daily at 12 noon.      famotidine-calcium carbonate-magnesium hydroxide (PEPCID COMPLETE) 10-800-165 MG chewable tablet Chew 1 tablet by mouth daily as needed (acid reflux/heartburn).     HYDROcodone-acetaminophen (NORCO/VICODIN) 5-325 MG tablet Take 1-2 tablets by mouth every 4 (four) hours as needed for severe pain. 30 tablet 0   Methylcellulose, Laxative, (CITRUCEL PO) Take 4 tablets by mouth daily at 12 noon.     methylPREDNISolone (MEDROL DOSEPAK) 4 MG TBPK tablet 6 day dose pack - take as directed 21 tablet 0   Multiple Vitamin (MULTIVITAMIN WITH MINERALS) TABS tablet Take 1 tablet by mouth daily at 12 noon.      Probiotic Product (PROBIOTIC PO) Take 1 tablet by mouth daily at 12 noon.     valACYclovir (VALTREX) 1000 MG tablet 2 tablets     No current facility-administered medications  on file prior to visit.    Past Surgical History:  Procedure Laterality Date   COLON SURGERY     INGUINAL HERNIA REPAIR Right 02/26/2015   Procedure: OPEN RIGHT INGUINAL HERNIA REPAIR ;  Surgeon: Johnathan Hausen, MD;  Location: [facility];  Service: General;  Laterality: Right;  With [place] N/A 02/26/2015   Procedure: MIDLINE SCAR REVISION;  Surgeon: Johnathan Hausen, MD;  Location: [facility];  Service: General;  Laterality: N/A;    Allergies  Allergen Reactions   Other Anaphylaxis    Reaction to "gastroview"   Sulfa Antibiotics Hives and Rash    Child hood reaction    Sulfa Drugs Cross Reactors Hives and Rash    Childhood allergy    BP 124/80   Ht 6' (1.829 m)   Wt 220 lb (99.8 kg)   BMI 29.84 kg/m       No data to display              No data to display              Objective:  Physical Exam:  Gen: NAD, comfortable in exam room  Bilateral feet/ankles: Transverse arch collapse left more than right - splaying between 2nd and 3rd digits on left. Medial rotation 4th and  5th digits on left, 5th digit on right. Mild hallux rigidus. Mild tenderness under MT heads 2-4 on left.  Mild tenderness at PF insertion on right. FROM ankle with normal strength. NVI distally.  Assessment & Plan:  1. Bilateral foot pain - 2/2 metatarsalgia on left, plantar fasciitis on right.  Custom orthotics made as below.  Patient was fitted for a : standard, cushioned, semi-rigid orthotic. The orthotic was heated and afterward the patient stood on the orthotic blank positioned on the orthotic stand. The patient was positioned in subtalar neutral position and 10 degrees of ankle dorsiflexion in a weight bearing stance. After completion of molding, a stable base was applied to the orthotic blank. The blank was ground to a stable position for weight bearing. Size: 14 Base: med density eva Posting: none Additional orthotic padding: medium MT pad on left

## 2022-11-30 ENCOUNTER — Other Ambulatory Visit: Payer: Self-pay | Admitting: Family Medicine

## 2022-11-30 DIAGNOSIS — R1084 Generalized abdominal pain: Secondary | ICD-10-CM

## 2022-12-14 ENCOUNTER — Ambulatory Visit
Admission: RE | Admit: 2022-12-14 | Discharge: 2022-12-14 | Disposition: A | Discharge status: Home / Self Care | Payer: No Typology Code available for payment source | Source: Ambulatory Visit | Attending: Family Medicine | Admitting: Family Medicine

## 2022-12-14 DIAGNOSIS — R1084 Generalized abdominal pain: Secondary | ICD-10-CM

## 2023-10-22 ENCOUNTER — Encounter: Payer: Self-pay | Admitting: Gastroenterology

## 2024-01-08 ENCOUNTER — Encounter: Payer: Self-pay | Admitting: Gastroenterology

## 2024-01-08 ENCOUNTER — Ambulatory Visit: Payer: No Typology Code available for payment source | Admitting: Gastroenterology

## 2024-01-08 VITALS — BP 124/70 | HR 80 | Ht 72.0 in | Wt 215.0 lb

## 2024-01-08 DIAGNOSIS — Z1211 Encounter for screening for malignant neoplasm of colon: Secondary | ICD-10-CM

## 2024-01-08 DIAGNOSIS — Z860100 Personal history of colon polyps, unspecified: Secondary | ICD-10-CM | POA: Diagnosis not present

## 2024-01-08 DIAGNOSIS — Z9049 Acquired absence of other specified parts of digestive tract: Secondary | ICD-10-CM

## 2024-01-08 DIAGNOSIS — K5732 Diverticulitis of large intestine without perforation or abscess without bleeding: Secondary | ICD-10-CM

## 2024-01-08 DIAGNOSIS — Z8719 Personal history of other diseases of the digestive system: Secondary | ICD-10-CM

## 2024-01-08 NOTE — Progress Notes (Addendum)
 HPI : [name] is a 58 y.o. male with a history of recurrent diverticulitis presents today to establish GI care.  He was previously followed by Dr. Kinnie Scales. He reports a long history of recurrent diverticulitis, going back at least 15 years.  He he underwent a partial colectomy he believes in 2009 because of recurrent diverticulitis.  Following that surgery, he had about 10 years when he he had no episodes, but has had a few mild episodes since 2019.  He has never had complicated diverticulitis (perforation, abscess).  He has never had to be hospitalized or received IV antibiotics for diverticulitis. He currently takes 5 capsules of psyllium daily, and has been taking fiber supplements for many years now.  He sometimes struggles with constipation, but the fiber helps keep him regular, with a bowel movement most days. Other than mild constipation, he denies any chronic GI symptoms such as abdominal pain, diarrhea or blood in the stool.  He has had 3 colonoscopies, most recently in January 2023.  He thinks he has had a few polyps and recalls Dr. Kinnie Scales recommending him to repeat colonoscopy in 3 to 5 years because of the diverticulitis.  He is adopted and has no known family history of colon cancer.   CT Abdomen/Pelvis Dec 14, 2022 IMPRESSION: 1. Mild diverticulosis of the descending and sigmoid colon. No evidence of acute diverticulitis. 2.  Aortic Atherosclerosis (ICD10-I70.0).  CT Nov 2022 IMPRESSION: 1. Acute uncomplicated descending colonic diverticulitis. 2. Aortic Atherosclerosis (ICD10-I70.0)  CT Oct 30, 2018 IMPRESSION: Evidence of mild acute diverticulitis at the junction of the descending to sigmoid colon in the left lower quadrant. No perforation or diverticular abscess.  RUQUS Aug 2015 IMPRESSION:  1. No cholelithiasis or sonographic evidence of acute cholecystitis.  2. Small right renal cyst.   CT Sept 2013 IMPRESSION:  No evidence of acute abnormality.    CT January 16, 2007 IMPRESSION:  1. Normal appendix.   2. Inflammatory change involving the sigmoid colon most consistent with sigmoid diverticulitis. A tiny microperforation characterized by a small extraluminal gas bubble is noted. Other forms of infectious colitis as well as neoplasm are in the differential. A follow-up study is recommended to ensure resolution.   CT January 14, 2005 IMPRESSION:  1.   Moderate inflammation in the left lateral pelvis adjacent to sigmoid colon which has numerous diverticula.  This most likely represents diverticulitis but recommend follow-up.  2.   Small amount of free fluid.   Past Medical History:  Diagnosis Date   Anxiety    Chest pain    Diverticulitis    GERD (gastroesophageal reflux disease)    History of cold urticaria    PFO (patent foramen ovale)    PFO (patent foramen ovale)    Sleep apnea    mild sleep apnea    TIA (transient ischemic attack)      Past Surgical History:  Procedure Laterality Date   COLON SURGERY     INGUINAL HERNIA REPAIR Right 02/26/2015   Procedure: OPEN RIGHT INGUINAL HERNIA REPAIR ;  Surgeon: Luretha Murphy, MD;  Location: [facility];  Service: General;  Laterality: Right;  With [place]   MOUTH SURGERY     SCAR REVISION N/A 02/26/2015   Procedure: MIDLINE SCAR REVISION;  Surgeon: Luretha Murphy, MD;  Location: [facility];  Service: General;  Laterality: N/A;   Family History  Adopted: Yes   Social History   Tobacco Use   Smoking status: Never   Smokeless tobacco: Never  Vaping  Use   Vaping status: Never Used  Substance Use Topics   Alcohol use: Yes    Comment: 1-2 drinks daily   Drug use: No   Current Outpatient Medications  Medication Sig Dispense Refill   acyclovir ointment (ZOVIRAX) 5 % 1 application every 3 hours     aspirin EC 81 MG tablet Take 81 mg by mouth daily at 12 noon.      famotidine-calcium carbonate-magnesium hydroxide (PEPCID COMPLETE) 10-800-165 MG chewable tablet Chew 1 tablet by mouth daily  as needed (acid reflux/heartburn).     ibuprofen (ADVIL) 400 MG tablet 1 tablet with food or milk as needed Orally Three times a day     irbesartan (AVAPRO) 150 MG tablet 1 tablet Orally Once a day     Methylcellulose, Laxative, (CITRUCEL PO) Take 4 tablets by mouth daily at 12 noon.     methylPREDNISolone (MEDROL DOSEPAK) 4 MG TBPK tablet 6 day dose pack - take as directed 21 tablet 0   Multiple Vitamin (MULTIVITAMIN WITH MINERALS) TABS tablet Take 1 tablet by mouth daily at 12 noon.      Probiotic Product (PROBIOTIC PO) Take 1 tablet by mouth daily at 12 noon.     rosuvastatin (CRESTOR) 5 MG tablet 1/2 tablet Orally Mon, Wed, Friday     valACYclovir (VALTREX) 1000 MG tablet 2 tablets     amLODipine (NORVASC) 2.5 MG tablet Take 2.5 mg by mouth daily at 12 noon.     amLODipine (NORVASC) 2.5 MG tablet Take 1 tablet by mouth daily.     amoxicillin-clavulanate (AUGMENTIN) 875-125 MG tablet Take 1 tablet by mouth every 12 (twelve) hours. (Patient not taking: Reported on 01/08/2024) 14 tablet 0   HYDROcodone-acetaminophen (NORCO/VICODIN) 5-325 MG tablet Take 1-2 tablets by mouth every 4 (four) hours as needed for severe pain. (Patient not taking: Reported on 01/08/2024) 30 tablet 0   No current facility-administered medications for this visit.   Allergies  Allergen Reactions   Other Anaphylaxis    Reaction to "gastroview"   Sulfa Antibiotics Hives and Rash    Child hood reaction    Sulfa Drugs Cross Reactors Hives and Rash    Childhood allergy     Review of Systems: All systems reviewed and negative except where noted in HPI.    No results found.  Physical Exam: BP 124/70   Pulse 80   Ht 6' (1.829 m)   Wt 215 lb (97.5 kg)   BMI 29.16 kg/m  Constitutional: Pleasant,well-developed, Caucasian male in no acute distress. HEENT: Normocephalic and atraumatic. Conjunctivae are normal. No scleral icterus. Neck supple.  Cardiovascular: Normal rate, regular rhythm.  Pulmonary/chest: Effort  normal and breath sounds normal. No wheezing, rales or rhonchi. Abdominal: Soft, nondistended, mild tenderness to palpation right lower quadrant.  Well-healed infraumbilical vertical surgical scar.  bowel sounds active throughout. There are no masses palpable. No hepatomegaly. Extremities: no edema Neurological: Alert and oriented to person place and time. Skin: Skin is warm and dry. No rashes noted. Psychiatric: Normal mood and affect. Behavior is normal.  CBC    Component Value Date/Time   WBC 7.8 09/10/2021 0538   RBC 4.47 09/10/2021 0538   HGB 14.4 09/10/2021 0538   HCT 42.1 09/10/2021 0538   PLT 145 (L) 09/10/2021 0538   MCV 94.2 09/10/2021 0538   MCH 32.2 09/10/2021 0538   MCHC 34.2 09/10/2021 0538   RDW 12.1 09/10/2021 0538   LYMPHSABS 1.9 09/09/2021 1535   MONOABS 1.0 09/09/2021 1535   EOSABS  0.1 09/09/2021 1535   BASOSABS 0.1 09/09/2021 1535    CMP     Component Value Date/Time   NA 137 09/09/2021 1535   K 3.9 09/09/2021 1535   CL 104 09/09/2021 1535   CO2 27 09/09/2021 1535   GLUCOSE 84 09/09/2021 1535   BUN 10 09/09/2021 1535   CREATININE 1.18 09/09/2021 1535   CALCIUM 9.1 09/09/2021 1535   PROT 7.1 09/09/2021 1535   ALBUMIN 4.3 09/09/2021 1535   AST 24 09/09/2021 1535   ALT 24 09/09/2021 1535   ALKPHOS 67 09/09/2021 1535   BILITOT 2.6 (H) 09/09/2021 1535   GFRNONAA >60 09/09/2021 1535   GFRAA >60 10/30/2018 1950       Latest Ref Rng & Units 09/10/2021    5:38 AM 09/09/2021    3:35 PM 07/11/2021    8:23 PM  CBC EXTENDED  WBC 4.0 - 10.5 K/uL 7.8  11.1  9.4   RBC 4.22 - 5.81 MIL/uL 4.47  4.89  4.85   Hemoglobin 13.0 - 17.0 g/dL 25.3  66.4  40.3   HCT 39.0 - 52.0 % 42.1  45.2  44.0   Platelets 150 - 400 K/uL 145  194  167   NEUT# 1.7 - 7.7 K/uL  8.0  5.9   Lymph# 0.7 - 4.0 K/uL  1.9  2.3       ASSESSMENT AND PLAN:  58 year old man with history of recurrent diverticulitis, status post prophylactic colectomy, currently without any chronic symptoms.   Also with reported history of polyps, but details unknown.  Diverticulitis Recurrent diverticulitis with previous colectomy (2008-2009). Last episode managed conservatively without antibiotics within the past year. Occasional constipation managed with psyllium fiber. No current symptoms. Fiber supplements may reduce future episodes.  - Continue psyllium fiber supplements  - Monitor for symptoms and contact clinic if flare-up occurs - Avoid routine antibiotics for mild symptoms to reduce C. difficile risk or antibiotic resistance  Colon Cancer Screening/history of polyps History of colon polyps, most recent colonoscopy in January 2023, possibly with polyps. Previous recommendations for colonoscopy every 3-5 years due to diverticulitis?  Provided clarification to patient that history of diverticulitis does not require more frequent colonoscopies, but that a colonoscopy may be recommended after an episode of diverticulitis to exclude malignancy as a cause of masquerading diverticulitis.  Current guidelines suggest 7-10 years for small, benign polyps. No family history of colon cancer. - Request previous colonoscopy records from Dr. Jennye Boroughs office to determine appropriate interval for next colonoscopy     Vallarie Fei E. Tomasa Rand, MD [facility]  I spent a total of 30 minutes reviewing the patient's medical record, interviewing and examining the patient, discussing his diagnosis and management of his condition going forward, and documenting in the medical record   No ref. provider found  --------------------------------------------------------------------------------------------------------------------  We received a colonoscopy report from Dr. Jennye Boroughs office He had a colonoscopy December 06, 2021 Indication: History of adenomatous polyps Diverticulosis was noted to be mild in the left colon, as well as a few scattered diverticula in the right colon.  An end to end colocolonic  anastomosis was notable at 22 cm.  No polyps were noted.  The bowel prep quality was reported as good. He was recommended to repeat colonoscopy in 5 years (presumably due to history of adenomatous polyps)  Based on this information, I would recommend a repeat colonoscopy in January 2028

## 2024-01-08 NOTE — Patient Instructions (Signed)
 Continue Psyllium as directed.   We will try to obtain records from Dr. Jennye Boroughs office.   _______________________________________________________  If your blood pressure at your visit was 140/90 or greater, please contact your primary care physician to follow up on this.  _______________________________________________________  If you are age 58 or older, your body mass index should be between 23-30. Your Body mass index is 29.16 kg/m. If this is out of the aforementioned range listed, please consider follow up with your Primary Care Provider.  If you are age 74 or younger, your body mass index should be between 19-25. Your Body mass index is 29.16 kg/m. If this is out of the aformentioned range listed, please consider follow up with your Primary Care Provider.   ________________________________________________________  The [facility] providers would like to encourage you to use [facility] to communicate with providers for non-urgent requests or questions.  Due to long hold times on the telephone, sending your provider a message by [facility] may be a faster and more efficient way to get a response.  Please allow 48 business hours for a response.  Please remember that this is for non-urgent requests.  _______________________________________________________  Thank you for choosing me and [facility].  Dr. Tiajuana Amass

## 2024-04-29 ENCOUNTER — Other Ambulatory Visit: Payer: Self-pay | Admitting: Rehabilitative and Restorative Service Providers"

## 2024-04-29 DIAGNOSIS — R931 Abnormal findings on diagnostic imaging of heart and coronary circulation: Secondary | ICD-10-CM

## 2024-04-29 DIAGNOSIS — Z8673 Personal history of transient ischemic attack (TIA), and cerebral infarction without residual deficits: Secondary | ICD-10-CM

## 2024-04-29 DIAGNOSIS — E782 Mixed hyperlipidemia: Secondary | ICD-10-CM

## 2024-04-30 ENCOUNTER — Ambulatory Visit
Admission: RE | Admit: 2024-04-30 | Discharge: 2024-04-30 | Disposition: A | Discharge status: Home / Self Care | Source: Ambulatory Visit | Attending: Rehabilitative and Restorative Service Providers" | Admitting: Rehabilitative and Restorative Service Providers"

## 2024-04-30 ENCOUNTER — Encounter: Payer: Self-pay | Admitting: Rehabilitative and Restorative Service Providers"

## 2024-04-30 DIAGNOSIS — E782 Mixed hyperlipidemia: Secondary | ICD-10-CM

## 2024-04-30 DIAGNOSIS — R931 Abnormal findings on diagnostic imaging of heart and coronary circulation: Secondary | ICD-10-CM

## 2024-04-30 DIAGNOSIS — Z8673 Personal history of transient ischemic attack (TIA), and cerebral infarction without residual deficits: Secondary | ICD-10-CM

## 2024-06-13 ENCOUNTER — Encounter (HOSPITAL_COMMUNITY): Payer: Self-pay

## 2024-06-13 ENCOUNTER — Ambulatory Visit (HOSPITAL_COMMUNITY)
Admission: RE | Admit: 2024-06-13 | Discharge: 2024-06-13 | Disposition: A | Discharge status: Home / Self Care | Source: Ambulatory Visit | Attending: Internal Medicine | Admitting: Internal Medicine

## 2024-06-13 VITALS — BP 125/83 | HR 60 | Temp 98.6°F | Resp 16

## 2024-06-13 DIAGNOSIS — R0981 Nasal congestion: Secondary | ICD-10-CM

## 2024-06-13 DIAGNOSIS — J029 Acute pharyngitis, unspecified: Secondary | ICD-10-CM

## 2024-06-13 LAB — POCT RAPID STREP A (OFFICE): Rapid Strep A Screen: NEGATIVE

## 2024-06-13 MED ORDER — FLUTICASONE PROPIONATE 50 MCG/ACT NA SUSP: 2.0000 | Freq: Every day | NASAL | 2 refills | Status: AC

## 2024-06-13 NOTE — Discharge Instructions (Addendum)
 Strep testing done today was negative. Symptoms and physical exam findings as well as duration and most consistent with a viral infection.  This does not require antibiotic treatment.  We focus treatment on improving the symptoms.  We will treat with the following:  Flonase  2 sprays each nostril once daily for nasal congestion.  May use as needed after this.  Continue alternating tylenol  and ibuprofen for sore throat Stay hydrated.  Return to urgent care or PCP if symptoms worsen or fail to resolve.

## 2024-06-13 NOTE — ED Triage Notes (Signed)
 Pt c/o sore throat for a couple days. Tried cough drops and throat spray. Hasn't taken any pain relievers. Pt is concerned for strep.

## 2024-06-13 NOTE — ED Provider Notes (Signed)
 [facility]    CSN: [id number] Arrival date & time: 06/13/24  1421      History   Chief Complaint Chief Complaint  Patient presents with   Sore Throat   appt 230p    HPI Lelan Cush is a 58 y.o. male.   58 y.o. male who presents to urgent care with complaints of sore throat, sinus congestion, cough with clear to greenish mucus.  He reports his symptoms started about 4 days ago.  It was mostly sore throat in the beginning but has developed into significant sinus congestion.  He also has a cough that is at night.  He wears a CPAP at night and reports it has been very difficult due to the congestion.  He has not had any fevers, shortness of breath, headache, chills.  He did have some fatigue yesterday but that has mostly resolved today.  He continues to have a very sore throat.  He was concerned he may have strep throat.   Sore Throat Pertinent negatives include no chest pain, no abdominal pain and no shortness of breath.    Past Medical History:  Diagnosis Date   Anxiety    Chest pain    Diverticulitis    GERD (gastroesophageal reflux disease)    History of cold urticaria    PFO (patent foramen ovale)    PFO (patent foramen ovale)    Sleep apnea    mild sleep apnea    TIA (transient ischemic attack)     Patient Active Problem List   Diagnosis Date Noted   Adjustment reaction 10/19/2021   Benign neoplasm of colon 10/19/2021   Bilateral tinnitus 10/19/2021   Diverticulitis of colon 10/19/2021   Elevated blood-pressure reading without diagnosis of hypertension 10/19/2021   Gastroesophageal reflux disease 10/19/2021   Herpes simplex type 1 infection 10/19/2021   Inguinal hernia 10/19/2021   Labile blood pressure 10/19/2021   Lump in scrotum 10/19/2021   Obstructive sleep apnea syndrome 10/19/2021   Pure hypercholesterolemia 10/19/2021   Stress at home 10/19/2021   Unspecified abnormal finding in specimens from other organs, systems and tissues  10/19/2021   Encounter for other specified special examinations 10/19/2021   Acute diverticulitis 09/09/2021   HTN (hypertension) 09/09/2021   Chest pain 04/07/2014    Past Surgical History:  Procedure Laterality Date   COLON SURGERY     INGUINAL HERNIA REPAIR Right 02/26/2015   Procedure: OPEN RIGHT INGUINAL HERNIA REPAIR ;  Surgeon: Donnice Lunger, MD;  Location: [facility];  Service: General;  Laterality: Right;  With MESH   MOUTH SURGERY     SCAR REVISION N/A 02/26/2015   Procedure: MIDLINE SCAR REVISION;  Surgeon: Donnice Lunger, MD;  Location: [facility];  Service: General;  Laterality: N/A;       Home Medications    Prior to Admission medications   Medication Sig Start Date End Date Taking? Authorizing Provider  fluticasone  (FLONASE ) 50 MCG/ACT nasal spray Place 2 sprays into both nostrils daily. 06/13/24  Yes Laniqua Torrens A, PA-C  acyclovir ointment (ZOVIRAX) 5 % 1 application every 3 hours 02/19/20   [provider]  amoxicillin -clavulanate (AUGMENTIN ) 875-125 MG tablet Take 1 tablet by mouth every 12 (twelve) hours. Patient not taking: Reported on 01/08/2024 09/09/21   Raspet, Erin K, PA-C  aspirin EC 81 MG tablet Take 81 mg by mouth daily at 12 noon.     [provider]  famotidine-calcium carbonate-magnesium hydroxide (PEPCID COMPLETE) 10-800-165 MG chewable tablet Chew 1 tablet by  mouth daily as needed (acid reflux/heartburn).    [provider]  HYDROcodone -acetaminophen  (NORCO/VICODIN) 5-325 MG tablet Take 1-2 tablets by mouth every 4 (four) hours as needed for severe pain. Patient not taking: Reported on 01/08/2024 09/10/21   Lue Elsie BROCKS, MD  ibuprofen (ADVIL) 400 MG tablet 1 tablet with food or milk as needed Orally Three times a day    [provider]  irbesartan (AVAPRO) 150 MG tablet 1 tablet Orally Once a day 02/26/23   [provider]  Methylcellulose, Laxative, (CITRUCEL PO) Take 4 tablets by mouth daily at 12 noon.     [provider]  methylPREDNISolone  (MEDROL  DOSEPAK) 4 MG TBPK tablet 6 day dose pack - take as directed 10/19/21   Evans, Brent M, DPM  Multiple Vitamin (MULTIVITAMIN WITH MINERALS) TABS tablet Take 1 tablet by mouth daily at 12 noon.     [provider]  Probiotic Product (PROBIOTIC PO) Take 1 tablet by mouth daily at 12 noon.    [provider]  rosuvastatin (CRESTOR) 5 MG tablet 1/2 tablet Orally Mon, Wed, Friday 02/26/23   [provider]  valACYclovir (VALTREX) 1000 MG tablet 2 tablets 03/11/12   [provider]    Family History Family History  Adopted: Yes    Social History Social History   Tobacco Use   Smoking status: Never   Smokeless tobacco: Never  Vaping Use   Vaping status: Never Used  Substance Use Topics   Alcohol use: Yes    Comment: 1-2 drinks daily   Drug use: No     Allergies   Other, Sulfa antibiotics, and Sulfa drugs cross reactors   Review of Systems Review of Systems  Constitutional:  Negative for chills and fever.  HENT:  Positive for congestion and sore throat. Negative for ear pain.   Eyes:  Negative for pain and visual disturbance.  Respiratory:  Negative for cough and shortness of breath.   Cardiovascular:  Negative for chest pain and palpitations.  Gastrointestinal:  Negative for abdominal pain and vomiting.  Genitourinary:  Negative for dysuria and hematuria.  Musculoskeletal:  Negative for arthralgias and back pain.  Skin:  Negative for color change and rash.  Neurological:  Negative for seizures and syncope.  All other systems reviewed and are negative.    Physical Exam Triage Vital Signs ED Triage Vitals [06/13/24 1447]  Encounter Vitals Group     BP 125/83     Girls Systolic BP Percentile      Girls Diastolic BP Percentile      Boys Systolic BP Percentile      Boys Diastolic BP Percentile      Pulse Rate 60     Resp 16     Temp 98.6 F (37 C)     Temp Source Oral     SpO2 96 %      Weight      Height      Head Circumference      Peak Flow      Pain Score 7     Pain Loc      Pain Education      Exclude from Growth Chart    No data found.  Updated Vital Signs BP 125/83 (BP Location: Right Arm)   Pulse 60   Temp 98.6 F (37 C) (Oral)   Resp 16   SpO2 96%   Visual Acuity Right Eye Distance:   Left Eye Distance:   Bilateral Distance:  Right Eye Near:   Left Eye Near:    Bilateral Near:     Physical Exam Vitals and nursing note reviewed.  Constitutional:      General: He is not in acute distress.    Appearance: He is well-developed.  HENT:     Head: Normocephalic and atraumatic.     Nose: Mucosal edema and congestion present.     Mouth/Throat:     Mouth: Mucous membranes are moist.     Pharynx: Posterior oropharyngeal erythema and postnasal drip present.  Eyes:     Conjunctiva/sclera: Conjunctivae normal.  Cardiovascular:     Rate and Rhythm: Normal rate and regular rhythm.     Heart sounds: No murmur heard. Pulmonary:     Effort: Pulmonary effort is normal. No respiratory distress.     Breath sounds: Normal breath sounds.  Abdominal:     Palpations: Abdomen is soft.     Tenderness: There is no abdominal tenderness.  Musculoskeletal:        General: No swelling.     Cervical back: Neck supple.  Skin:    General: Skin is warm and dry.     Capillary Refill: Capillary refill takes less than 2 seconds.  Neurological:     Mental Status: He is alert.  Psychiatric:        Mood and Affect: Mood normal.      UC Treatments / Results  Labs (all labs ordered are listed, but only abnormal results are displayed) Labs Reviewed  POCT RAPID STREP A (OFFICE) - Normal    EKG   Radiology No results found.  Procedures Procedures (including critical care time)  Medications Ordered in UC Medications - No data to display  Initial Impression / Assessment and Plan / UC Course  I have reviewed the triage vital signs and the nursing  notes.  Pertinent labs & imaging results that were available during my care of the patient were reviewed by me and considered in my medical decision making (see chart for details).     Sore throat - Plan: POC rapid strep A, POC rapid strep A  Nasal sinus congestion    Strep testing done today was negative. Symptoms and physical exam findings as well as duration and most consistent with a viral infection.  This does not require antibiotic treatment.  We focus treatment on improving the symptoms.  We will treat with the following:  Flonase  2 sprays each nostril once daily for nasal congestion.  May use as needed after this.  Continue alternating tylenol  and ibuprofen for sore throat Stay hydrated.  Return to urgent care or PCP if symptoms worsen or fail to resolve.      Final Clinical Impressions(s) / UC Diagnoses   Final diagnoses:  Sore throat  Nasal sinus congestion     Discharge Instructions      Strep testing done today was negative. Symptoms and physical exam findings as well as duration and most consistent with a viral infection.  This does not require antibiotic treatment.  We focus treatment on improving the symptoms.  We will treat with the following:  Flonase  2 sprays each nostril once daily for nasal congestion.  May use as needed after this.  Continue alternating tylenol  and ibuprofen for sore throat Stay hydrated.  Return to urgent care or PCP if symptoms worsen or fail to resolve.       ED Prescriptions     Medication Sig Dispense Auth. Provider   fluticasone  (FLONASE ) 50 MCG/ACT  nasal spray Place 2 sprays into both nostrils daily. 9.9 mL Teresa Almarie LABOR, [place]      PDMP not reviewed this encounter.   Teresa Almarie LABOR, [place] 06/13/24 1535

## 2024-06-30 ENCOUNTER — Encounter (INDEPENDENT_AMBULATORY_CARE_PROVIDER_SITE_OTHER): Payer: Self-pay | Admitting: Otolaryngology

## 2024-06-30 ENCOUNTER — Ambulatory Visit (INDEPENDENT_AMBULATORY_CARE_PROVIDER_SITE_OTHER): Payer: No Typology Code available for payment source | Admitting: Otolaryngology

## 2024-06-30 VITALS — BP 124/77 | HR 58

## 2024-06-30 DIAGNOSIS — H903 Sensorineural hearing loss, bilateral: Secondary | ICD-10-CM

## 2024-06-30 DIAGNOSIS — H9313 Tinnitus, bilateral: Secondary | ICD-10-CM | POA: Diagnosis not present

## 2024-07-01 DIAGNOSIS — H903 Sensorineural hearing loss, bilateral: Secondary | ICD-10-CM | POA: Insufficient documentation

## 2024-07-01 NOTE — Progress Notes (Signed)
 Patient ID: [name], male   DOB: 28-Sep-1966, 57 y.o.   MRN: [id number]  Follow-up: Bilateral tinnitus  HPI: The patient is a 58 year old male who returns today for his follow-up evaluation.  He was last seen 1 year ago.  At that time, he was complaining of bilateral tinnitus.  He described the tinnitus as a constant high-pitched ringing noise.  He was noted to have bilateral high-frequency sensorineural hearing loss.  The strategies to cope with tinnitus were discussed.  The patient returns today reporting fluctuating bilateral tinnitus.  He denies any change in his hearing.  Currently he denies any otalgia, otorrhea, or vertigo.  The patient recently saw Dr. Melba for his TMJ disorder.  He was fitted with bite guards.  Exam: General: Communicates without difficulty, well nourished, no acute distress. Head: Normocephalic, no evidence injury, no tenderness, facial buttresses intact without stepoff. Face/sinus: No tenderness to palpation and percussion. Facial movement is normal and symmetric. Eyes: PERRL, EOMI. No scleral icterus, conjunctivae clear. Neuro: CN II exam reveals vision grossly intact.  No nystagmus at any point of gaze. Ears: Auricles well formed without lesions.  Ear canals are intact without mass or lesion.  No erythema or edema is appreciated.  The TMs are intact without fluid. Nose: External evaluation reveals normal support and skin without lesions.  Dorsum is intact.  Anterior rhinoscopy reveals normal mucosa over anterior aspect of inferior turbinates and intact septum.  No purulence noted. Oral:  Oral cavity and oropharynx are intact, symmetric, without erythema or edema.  Mucosa is moist without lesions. Neck: Full range of motion without pain.  There is no significant lymphadenopathy.  No masses palpable.  Thyroid bed within normal limits to palpation.  Parotid glands and submandibular glands equal bilaterally without mass.  Trachea is midline. Neuro:  CN 2-12 grossly intact.    Assessment: 1.  Subjectively stable bilateral high-frequency sensorineural hearing loss and recurrent tinnitus. 2.  His ear canals, tympanic membranes, and middle ear spaces are all normal.  Plan: 1.  The physical exam findings are reviewed with the patient. 2.  The strategies to cope with tinnitus, including the use of masker, hearing aids, tinnitus retraining therapy, and avoidance of caffeine and alcohol are discussed. 3.  The patient will return for reevaluation in 1 year, with repeat hearing test.
